# Patient Record
Sex: Female | Born: 1995 | Race: White | Hispanic: No | Marital: Married | Smoking: Never smoker
Health system: Southern US, Community
[De-identification: ages and names within clinical notes are randomized; demographics above are authoritative.]

## PROBLEM LIST (undated history)

## (undated) DIAGNOSIS — Q100 Congenital ptosis: Secondary | ICD-10-CM

## (undated) DIAGNOSIS — Q512 Other doubling of uterus, unspecified: Secondary | ICD-10-CM

## (undated) DIAGNOSIS — Q5128 Other doubling of uterus, other specified: Secondary | ICD-10-CM

## (undated) HISTORY — PX: BLEPHAROPLASTY: SUR158

## (undated) HISTORY — PX: OTHER SURGICAL HISTORY: SHX169

---

## 2017-03-19 ENCOUNTER — Encounter: Payer: Self-pay | Admitting: Medical

## 2017-03-19 ENCOUNTER — Ambulatory Visit (INDEPENDENT_AMBULATORY_CARE_PROVIDER_SITE_OTHER): Payer: 59 | Admitting: Medical

## 2017-03-19 VITALS — BP 128/80 | HR 81 | Ht 65.0 in | Wt 195.6 lb

## 2017-03-19 DIAGNOSIS — H1013 Acute atopic conjunctivitis, bilateral: Secondary | ICD-10-CM

## 2017-03-19 DIAGNOSIS — N926 Irregular menstruation, unspecified: Secondary | ICD-10-CM | POA: Diagnosis not present

## 2017-03-19 DIAGNOSIS — Z3201 Encounter for pregnancy test, result positive: Secondary | ICD-10-CM | POA: Diagnosis not present

## 2017-03-19 DIAGNOSIS — Z23 Encounter for immunization: Secondary | ICD-10-CM

## 2017-03-19 LAB — POCT URINE PREGNANCY: Preg Test, Ur: POSITIVE — AB

## 2017-03-19 NOTE — Patient Instructions (Signed)
Make sure you are taking a Prenatal vitamin  Begin Opcon A or other similar over the  Counter allergy eye drop  We updated your flu shot today  Try and get a copy of your vaccine records.   Eating Plan for Pregnant Women While you are pregnant, your body will require additional nutrition to help support your growing baby. It is recommended that you consume:  150 additional calories each day during your first trimester.  300 additional calories each day during your second trimester.  300 additional calories each day during your third trimester.  Eating a healthy, well-balanced diet is very important for your health and for your baby's health. You also have a higher need for some vitamins and minerals, such as folic acid, calcium, iron, and vitamin D. What do I need to know about eating during pregnancy?  Do not try to lose weight or go on a diet during pregnancy.  Choose healthy, nutritious foods. Choose  of a sandwich with a glass of milk instead of a candy bar or a high-calorie sugar-sweetened beverage.  Limit your overall intake of foods that have "empty calories." These are foods that have little nutritional value, such as sweets, desserts, candies, sugar-sweetened beverages, and fried foods.  Eat a variety of foods, especially fruits and vegetables.  Take a prenatal vitamin to help meet the additional needs during pregnancy, specifically for folic acid, iron, calcium, and vitamin D.  Remember to stay active. Ask your health care provider for exercise recommendations that are specific to you.  Practice good food safety and cleanliness, such as washing your hands before you eat and after you prepare raw meat. This helps to prevent foodborne illnesses, such as listeriosis, that can be very dangerous for your baby. Ask your health care provider for more information about listeriosis. What does 150 extra calories look like? Healthy options for an additional 150 calories each day  could be any of the following:  Plain low-fat yogurt (6-8 oz) with  cup of berries.  1 apple with 2 teaspoons of peanut butter.  Cut-up vegetables with  cup of hummus.  Low-fat chocolate milk (8 oz or 1 cup).  1 string cheese with 1 medium orange.   of a peanut butter and jelly sandwich on whole-wheat bread (1 tsp of peanut butter).  For 300 calories, you could eat two of those healthy options each day. What is a healthy amount of weight to gain? The recommended amount of weight for you to gain is based on your pre-pregnancy BMI. If your pre-pregnancy BMI was:  Less than 18 (underweight), you should gain 28-40 lb.  18-24.9 (normal), you should gain 25-35 lb.  25-29.9 (overweight), you should gain 15-25 lb.  Greater than 30 (obese), you should gain 11-20 lb.  What if I am having twins or multiples? Generally, pregnant women who will be having twins or multiples may need to increase their daily calories by 300-600 calories each day. The recommended range for total weight gain is 25-54 lb, depending on your pre-pregnancy BMI. Talk with your health care provider for specific guidance about additional nutritional needs, weight gain, and exercise during your pregnancy. What foods can I eat? Grains Any grains. Try to choose whole grains, such as whole-wheat bread, oatmeal, or brown rice. Vegetables Any vegetables. Try to eat a variety of colors and types of vegetables to get a full range of vitamins and minerals. Remember to wash your vegetables well before eating. Fruits Any fruits. Try to eat a  variety of colors and types of fruit to get a full range of vitamins and minerals. Remember to wash your fruits well before eating. Meats and Other Protein Sources Lean meats, including chicken, [place], fish, and lean cuts of beef, veal, or pork. Make sure that all meats are cooked to "well done." Tofu. Tempeh. Beans. Eggs. Peanut butter and other nut butters. Seafood, such as shrimp, crab,  and lobster. If you choose fish, select types that are higher in omega-3 fatty acids, including salmon, herring, mussels, trout, sardines, and pollock. Make sure that all meats are cooked to food-safe temperatures. Dairy Pasteurized milk and milk alternatives. Pasteurized yogurt and pasteurized cheese. Cottage cheese. Sour cream. Beverages Water. Juices that contain 100% fruit juice or vegetable juice. Caffeine-free teas and decaffeinated coffee. Drinks that contain caffeine are okay to drink, but it is better to avoid caffeine. Keep your total caffeine intake to less than 200 mg each day (12 oz of coffee, tea, or soda) or as directed by your health care provider. Condiments Any pasteurized condiments. Sweets and Desserts Any sweets and desserts. Fats and Oils Any fats and oils. The items listed above may not be a complete list of recommended foods or beverages. Contact your dietitian for more options. What foods are not recommended? Vegetables Unpasteurized (raw) vegetable juices. Fruits Unpasteurized (raw) fruit juices. Meats and Other Protein Sources Cured meats that have nitrates, such as bacon, salami, and hotdogs. Luncheon meats, bologna, or other deli meats (unless they are reheated until they are steaming hot). Refrigerated pate, meat spreads from a meat counter, smoked seafood that is found in the refrigerated section of a store. Raw fish, such as sushi or sashimi. High mercury content fish, such as tilefish, shark, swordfish, and king mackerel. Raw meats, such as tuna or beef tartare. Undercooked meats and poultry. Make sure that all meats are cooked to food-safe temperatures. Dairy Unpasteurized (raw) milk and any foods that have raw milk in them. Soft cheeses, such as feta, queso blanco, queso fresco, Brie, Camembert cheeses, blue-veined cheeses, and Panela cheese (unless it is made with pasteurized milk, which must be stated on the label). Beverages Alcohol. Sugar-sweetened  beverages, such as sodas, teas, or energy drinks. Condiments Homemade fermented foods and drinks, such as pickles, sauerkraut, or kombucha drinks. (Store-bought pasteurized versions of these are okay.) Other Salads that are made in the store, such as ham salad, chicken salad, egg salad, tuna salad, and seafood salad. The items listed above may not be a complete list of foods and beverages to avoid. Contact your dietitian for more information. This information is not intended to replace advice given to you by your health care provider. Make sure you discuss any questions you have with your health care provider. Document Released: 02/13/2014 Document Revised: 10/07/2015 Document Reviewed: 10/14/2013 Elsevier Interactive Patient Education  2018 [employer].    Common Medications Safe in Pregnancy  Acne:      Constipation:  Benzoyl Peroxide     Colace  Clindamycin      Dulcolax Suppository  Topica Erythromycin     Fibercon  Salicylic Acid      Metamucil         Miralax AVOID:        Senakot   Accutane    Cough:  Retin-A       Cough Drops  Tetracycline      Phenergan w/ Codeine if Rx  Minocycline      Robitussin (Plain & DM)  Antibiotics:  Crabs/Lice:  Ceclor       RID  Cephalosporins    AVOID:  E-Mycins      Kwell  Keflex  Macrobid/Macrodantin   Diarrhea:  Penicillin      Kao-Pectate  Zithromax      Imodium AD         PUSH FLUIDS AVOID:       Cipro     Fever:  Tetracycline      Tylenol (Regular or Extra  Minocycline       Strength)  Levaquin      Extra Strength-Do not          Exceed 8 tabs/24 hrs Caffeine:        '200mg'$ /day (equiv. To 1 cup of coffee or  approx. 3 12 oz sodas)         Gas: Cold/Hayfever:       Gas-X  Benadryl      Mylicon  Claritin       Phazyme  **Claritin-D        Chlor-Trimeton    Headaches:  Dimetapp      ASA-Free Excedrin  Drixoral-Non-Drowsy     Cold Compress  Mucinex (Guaifenasin)     Tylenol (Regular or Extra  Sudafed/Sudafed-12  Hour     Strength)  **Sudafed PE Pseudoephedrine   Tylenol Cold & Sinus     Vicks Vapor Rub  Zyrtec  **AVOID if Problems With Blood Pressure         Heartburn: Avoid lying down for at least 1 hour after meals  Aciphex      Maalox     Rash:  Milk of Magnesia     Benadryl    Mylanta       1% Hydrocortisone Cream  Pepcid  Pepcid Complete   Sleep Aids:  Prevacid      Ambien   Prilosec       Benadryl  Rolaids       Chamomile Tea  Tums (Limit 4/day)     Unisom  Zantac       Tylenol PM         Warm milk-add vanilla or  Hemorrhoids:       Sugar for taste  Anusol/Anusol H.C.  (RX: Analapram 2.5%)  Sugar Substitutes:  Hydrocortisone OTC     Ok in moderation  Preparation H      Tucks        Vaseline lotion applied to tissue with wiping    Herpes:     Throat:  Acyclovir      Oragel  Famvir  Valtrex     Vaccines:         Flu Shot Leg Cramps:       *Gardasil  Benadryl      Hepatitis A         Hepatitis B Nasal Spray:       Pneumovax  Saline Nasal Spray     Polio Booster         Tetanus Nausea:       Tuberculosis test or PPD  Vitamin B6 25 mg TID   AVOID:    Dramamine      *Gardasil  Emetrol       Live Poliovirus  Ginger Root 250 mg QID    MMR (measles, mumps &  High Complex Carbs @ Bedtime    rebella)  Sea Bands-Accupressure    Varicella (Chickenpox)  Unisom 1/2 tab TID     *No known complications  If received before Pain:         Known pregnancy;   Darvocet       Resume series after  Lortab        Delivery  Percocet    Yeast:   Tramadol      Femstat  Tylenol 3      Gyne-lotrimin  Ultram       Monistat  Vicodin           MISC:         All Sunscreens           Hair Coloring/highlights          Insect Repellant's          (Including DEET)         Mystic Tans

## 2017-03-19 NOTE — Progress Notes (Signed)
Subjective: Chief Complaint  Patient presents with  . New Patient (Initial Visit)    possible preg   Here as a new patient today.  [place]  [place]oved [place] end of [place].  Husband got job at [employer].   She works data entry.  She is here for recent home pregnancy test positive.   She notes having a positive pregnancy test 2 weeks ago.   LMP was 02/02/17.   Last birth control was 06/2016.  Had Nexplanon taken out in 06/2016.   Got married 06/2016, and although she has not actively been trying to get pregnant, not avoiding it either.   She recently started taking a prenatal vitamin.  No prior pregnancy.    She has been having some nausea, fatigue, some breast tenderness.   Nauseated in mornings in particular when she awakes.  Eats somewhat healthy, but also some eating out on the weekends.    She has been having irritated red and dry eyes.  No recent foreign body, no goupy discharge, no matted eyes.  No runny nose, no congestion.  No other concerns.      No past medical history on file.  No current outpatient medications on file prior to visit.   No current facility-administered medications on file prior to visit.    ROS as in subjective    Objective: BP 128/80   Pulse 81   Ht 5\' 5"  (1.651 m)   Wt 195 lb 9.6 oz (88.7 kg)   LMP 02/02/2017   SpO2 99%   BMI 32.55 kg/m   General appearance: alert, no distress, WD/WN,  HEENT: normocephalic, sclerae anicteric, +injected conjunctiva bilat, otherwise TMs pearly, nares patent, no discharge or erythema, pharynx normal Oral cavity: MMM, no lesions Neck: supple, no lymphadenopathy, no thyromegaly, no masses Heart: RRR, normal S1, S2, no murmurs Lungs: CTA bilaterally, no wheezes, rhonchi, or rales pulses: 2+ symmetric, upper and lower extremities, normal cap refill No edema    Assessment: Encounter Diagnoses  Name Primary?  . Missed periods Yes  . Need for influenza vaccination   . Pregnancy test positive   .  Allergic conjunctivitis of both eyes     Plan: Discussed + pregnancy test, advised she continue prenatal vitamins, discussed diet, exercise, avoidance of risky foods.  Serum beta hCG lab today and plan for referral to OB/Gyn.  Discussed need to begin prenatal care.  She will also try and get copy of her vaccine records.   Begin Opcon A OTC allergy drops for allergic conjunctivitis.  Counseled on the influenza virus vaccine.  Vaccine information sheet given.  Influenza vaccine given after consent obtained.  Emlyn was seen today for new patient (initial visit).  Diagnoses and all orders for this visit:  Missed periods -     POCT urine pregnancy -     hCG, quantitative, pregnancy  Need for influenza vaccination -     Flu Vaccine QUAD 6+ mos PF IM (Fluarix Quad PF)  Pregnancy test positive -     hCG, quantitative, pregnancy  Allergic conjunctivitis of both eyes

## 2017-03-20 ENCOUNTER — Telehealth: Payer: Self-pay | Admitting: Medical

## 2017-03-20 NOTE — Telephone Encounter (Signed)
Pt called for her labs, please call when we receive

## 2017-03-20 NOTE — Telephone Encounter (Signed)
Called and notified the pt and sent copy of labs.

## 2017-04-03 LAB — OB RESULTS CONSOLE ABO/RH: RH Type: NEGATIVE

## 2017-04-03 LAB — OB RESULTS CONSOLE HIV ANTIBODY (ROUTINE TESTING): HIV: NONREACTIVE

## 2017-04-03 LAB — OB RESULTS CONSOLE ANTIBODY SCREEN: Antibody Screen: NEGATIVE

## 2017-04-03 LAB — OB RESULTS CONSOLE RUBELLA ANTIBODY, IGM: Rubella: IMMUNE

## 2017-04-03 LAB — OB RESULTS CONSOLE HEPATITIS B SURFACE ANTIGEN: Hepatitis B Surface Ag: NEGATIVE

## 2017-04-03 LAB — OB RESULTS CONSOLE RPR: RPR: NONREACTIVE

## 2017-05-15 NOTE — L&D Delivery Note (Signed)
Delivery Note At 4:37 AM a viable female was delivered via Vaginal, Spontaneous (Presentation: ROA).  APGAR: 7, 9; weight pending.   Placenta status: S, I. 3V Cord with the following complications: none.  Cord pH: n/a  Anesthesia:  CLEA Episiotomy: None Lacerations: 2nd degree Suture Repair: 3.0 vicryl rapide Est. Blood Loss (mL):  250  Mom to postpartum.  Baby to Couplet care / Skin to Skin.  Anne Wood 10/29/2017, 5:01 AM

## 2017-05-30 DIAGNOSIS — Z3480 Encounter for supervision of other normal pregnancy, unspecified trimester: Secondary | ICD-10-CM | POA: Diagnosis not present

## 2017-05-30 DIAGNOSIS — Z363 Encounter for antenatal screening for malformations: Secondary | ICD-10-CM | POA: Diagnosis not present

## 2017-05-31 ENCOUNTER — Other Ambulatory Visit (HOSPITAL_COMMUNITY): Payer: Self-pay | Admitting: Obstetrics and Gynecology

## 2017-05-31 DIAGNOSIS — O283 Abnormal ultrasonic finding on antenatal screening of mother: Secondary | ICD-10-CM

## 2017-05-31 DIAGNOSIS — Z3689 Encounter for other specified antenatal screening: Secondary | ICD-10-CM

## 2017-05-31 DIAGNOSIS — Z3A19 19 weeks gestation of pregnancy: Secondary | ICD-10-CM

## 2017-06-07 ENCOUNTER — Encounter (HOSPITAL_COMMUNITY): Payer: Self-pay | Admitting: Obstetrics and Gynecology

## 2017-06-13 ENCOUNTER — Encounter (HOSPITAL_COMMUNITY): Payer: Self-pay | Admitting: *Deleted

## 2017-06-15 ENCOUNTER — Ambulatory Visit (HOSPITAL_COMMUNITY)
Admission: RE | Admit: 2017-06-15 | Discharge: 2017-06-15 | Disposition: A | Discharge status: Home / Self Care | Payer: BLUE CROSS/BLUE SHIELD | Source: Ambulatory Visit | Attending: Obstetrics and Gynecology | Admitting: Obstetrics and Gynecology

## 2017-06-15 ENCOUNTER — Encounter (HOSPITAL_COMMUNITY): Payer: Self-pay

## 2017-06-15 DIAGNOSIS — Z3689 Encounter for other specified antenatal screening: Secondary | ICD-10-CM

## 2017-06-15 DIAGNOSIS — Z368A Encounter for antenatal screening for other genetic defects: Secondary | ICD-10-CM | POA: Diagnosis not present

## 2017-06-15 DIAGNOSIS — O283 Abnormal ultrasonic finding on antenatal screening of mother: Secondary | ICD-10-CM

## 2017-06-15 DIAGNOSIS — Z3A19 19 weeks gestation of pregnancy: Secondary | ICD-10-CM | POA: Insufficient documentation

## 2017-06-15 HISTORY — DX: Congenital ptosis: Q10.0

## 2017-06-15 HISTORY — DX: Other doubling of uterus, unspecified: Q51.20

## 2017-06-15 HISTORY — DX: Other and unspecified doubling of uterus: Q51.28

## 2017-06-15 NOTE — Progress Notes (Signed)
Genetic Counseling  High-Risk Gestation Note  Appointment Date:  06/15/2017 Referred By: Zelphia CairoAdkins, Gretchen, MD Date of Birth:  [birth year] Partner:  Joselyn Glassmanyler   Pregnancy History: G2P0010 Estimated Date of Delivery: 11/09/17 Estimated Gestational Age: 2947w0d Attending: Particia NearingMartha Decker, MD   Mrs. Anzley Proano and her husband, Mr. Gerre Scullyler Noseworthy, were seen for genetic counseling because of the previous ultrasound finding of increased nuchal translucency.    In summary:  Reviewed increased nuchal translucency and associations  Discussed significance of prior screening for fetal aneuploidy- NIPS (Panorama) within normal limits  Offered additional screening  NIPS for single gene conditions (Vistara)- declined today  Ultrasound- performed today; see separate report  Fetal echocardiogram- patient reports this is scheduled  Discussed option of diagnostic testing  Amniocentesis for karyotype and microarray analysis- declined  Reviewed family history concerns  Ms. Lange previously had nuchal translucency (NT) assessment through her OB provider, which identified an increased NT measurement, reported to be 3.5-3.7 mm at CRL of 61 mm. Noninvasive prenatal screening (NIPS)/prenatal cell free DNA testing was performed. We reviewed that these results, [facility], were within normal limits for the chromosome conditions screened. Specifically, results indicated less than 1 in 10,000 risk for fetal Trisomy 21, Trisomy 18, Trisomy 13, and monosomy X. Triploidy and 22q11.2 deletion was also reported as low risk from the screening. Detailed ultrasound was performed today. Complete ultrasound results under separate cover.   We discussed that the fetal NT refers to a fluid filled space between the skin and soft tissues behind the cervical spine. This space is traditionally measurable between 11 and 13.[redacted] weeks gestation and is considered enlarged when the measurement is equal to or  greater than the 95th percentile for the gestational age. This couple was counseled regarding the various common etiologies for an enlarged NT including: aneuploidy, single gene conditions, cardiac or great vessel abnormalities, lymphatic system failure, decreased fetal movement, and fetal anemia.   We reviewed chromosomes, nondisjunction, and the common features and prognoses of Down syndrome and other aneuploidies. We reviewed the sensitivity and specificity of NIPS for aneuploidy. We reviewed the diagnostic testing option of amniocentesis for karyotype and chromosome microarray to assess for smaller chromosome aberrations. We reviewed risks, benefits, and limitations of amniocentesis, including the associated 1 in 300-500 risk for complications including spontaneous pregnancy loss.  We discussed the possible results that the tests might provide including: positive, negative, unanticipated, and no result.   In addition, we discussed that an NT of 3.5 mm is associated with an approximate 3% risk for a fetal cardiac anomaly. We discussed the options of a fetal echocardiogram and detailed anatomy ultrasound as methods to evaluate the fetal heart. Detailed ultrasound was performed today; see separate report. The patient reported that fetal echocardiogram was previously scheduled.   We also discussed single gene conditions. They were counseled that an increased NT is associated with an increased chance for specific single gene conditions including Noonan spectrum disorders, skeletal dysplasias, SLOS, CdLS, and many others. We discussed that these conditions are not routinely tested for prenatally unless ultrasound findings or family history significantly increase the suspicion of a specific single gene disorder; however, there is new noninvasive prenatal screening (NIPS) technology which assesses for specific alterations in 30 genes, including the genes associated with Noonan syndrome, some skeletal dysplasias,  and CdLS. We discussed that this testing, marketed as Javier DockerVistara, is available. We reviewed the benefits, limitations, and cost of this technology. They understand that many of the features of these conditions  can be detected by detailed ultrasound during the second trimester; however, ultrasound cannot definitively diagnose or rule out these conditions. Regarding autosomal recessive and some X-linked single gene conditions, we discussed the option of expanded carrier screening for the couple. We briefly discussed risks, benefits, and limitations of expanded carrier screening and variability of available screening panels.  We briefly reviewed common inheritance patterns (dominant, recessive, and X-linked) as well as the associated risks of recurrence. After careful consideration, Ms. Glowacki declined additional screening for single gene conditions at this time including Vistara and expanded carrier screening.   We then discussed that an increased NT value can be a normal variant, which can resolve during the pregnancy. They were counseled that the fetal prognosis depends on the underlying etiology of the enlarged NT and further anticipatory guidance can be provided if a diagnosis is discovered.   Both family histories were reviewed and found to be contributory for congenital heart disease for the father of the pregnancy and for his maternal half-brother. Mr. Komorowski reported that he was born with a hole in the heart that resolved over time and did not require surgical correction. He also reported that he has a kidney that is displaced but that the kidney function is within normal limits. He is currently 21 years old. Mr. Schexnider reported that his maternal half-brother has a hole in the heart which has not required surgical correction. He is currently 21 years old and otherwise healthy. We reviewed that congenital heart defects (CHDs) can be isolated or a feature of an underlying genetic condition. Congenital  heart defects are most often multifactorial in etiology, but can also result from chromosome aberrations, single gene conditions, or teratogenic exposures. We discussed that isolated, nonsyndromic CHDs occur in approximately 1% of the general population.We discussed that if Mr. Edgett's heart defect is isolated, then recurrence risk for his offspring is approximately 1.5-3%. However, it is possible that his kidney different and congenital heart disease are related to a single underlying etiology, in which case recurrence risk would depend upon the specific etiology. We reviewed that targeted ultrasound is available to assess fetal heart and kidney anatomy. The couple understands that ultrasound cannot diagnose or rule out all birth defects or genetic conditions prenatally.   Ms. Sather also reported two female maternal first cousins with behavior differences. They are her maternal uncle's sons and are half-siblings to each other, currently ages 51 and 21 years old. The patient reported that there were likely prenatal exposures for both individual to drugs and likely alcohol with one of the individuals. They do not have intellectual delay and were not described to have dysmorphic features. We discussed that the reported features can have environmental (including teratogenic), multifactorial, sporadic, or genetic causes. The reported family history is not particularly suggestive of a genetic syndrome. In the case of multifactorial or environmental causes, recurrence risk for the patient's offspring would likely be low. Without further information, an accurate risk assessment cannot be provided.  Without further information regarding the provided family history, an accurate genetic risk cannot be calculated. Further genetic counseling is warranted if more information is obtained.  Mrs. Camri Forbush denied exposure to environmental toxins or chemical agents. She denied the use of alcohol, tobacco or street  drugs. She denied significant viral illnesses during the course of her pregnancy. Her medical and surgical histories were noncontributory.   I counseled this couple regarding the above risks and available options.  The approximate face-to-face time with the genetic counselor was 40  minutes.   Quinn Plowman, MS Certified Genetic Counselor 06/15/2017

## 2017-06-18 ENCOUNTER — Other Ambulatory Visit (HOSPITAL_COMMUNITY): Payer: Self-pay

## 2017-06-18 ENCOUNTER — Encounter (HOSPITAL_COMMUNITY): Payer: Self-pay

## 2017-06-22 ENCOUNTER — Encounter: Payer: Self-pay | Admitting: Medical

## 2017-06-22 ENCOUNTER — Ambulatory Visit: Payer: BLUE CROSS/BLUE SHIELD | Admitting: Medical

## 2017-06-22 VITALS — BP 114/68 | HR 97 | Temp 98.3°F | Wt 199.4 lb

## 2017-06-22 DIAGNOSIS — R6889 Other general symptoms and signs: Secondary | ICD-10-CM

## 2017-06-22 DIAGNOSIS — J988 Other specified respiratory disorders: Secondary | ICD-10-CM | POA: Diagnosis not present

## 2017-06-22 DIAGNOSIS — Z3A20 20 weeks gestation of pregnancy: Secondary | ICD-10-CM | POA: Diagnosis not present

## 2017-06-22 DIAGNOSIS — J029 Acute pharyngitis, unspecified: Secondary | ICD-10-CM | POA: Diagnosis not present

## 2017-06-22 LAB — POCT RAPID STREP A (OFFICE): Rapid Strep A Screen: NEGATIVE

## 2017-06-22 LAB — POC INFLUENZA A&B (BINAX/QUICKVUE)
Influenza A, POC: NEGATIVE
Influenza B, POC: NEGATIVE

## 2017-06-22 NOTE — Addendum Note (Signed)
Addended by: Winn JockVALENTINE, Aubriel Khanna N on: 06/22/2017 01:10 PM   Modules accepted: Orders

## 2017-06-22 NOTE — Progress Notes (Signed)
Subjective: Chief Complaint  Patient presents with  . chills, congestion ,sore throat, bodyaches     stated last saturday , no fever    Here for illness.   Pregnant, 20 weeks at this point.   Sees Dr. Zelphia CairoGretchen Adkins.  Here for flu like symptoms, has had flu contacts.  She reports illness that started with sore throat 6 days ago.   Gradually has worsened this week.   She reports body aches, hot and cold flashes, sore throat, congested.  Had cough but this improved.  Using cough drops and throat numbing spray.  Using some Tylenol.   No other aggravating or relieving factors. No other complaint.  Past Medical History:  Diagnosis Date  . Congenital ptosis of both eyelids   . Uterus didelphys    Current Outpatient Medications on File Prior to Visit  Medication Sig Dispense Refill  . Prenatal Vit-Fe Fumarate-FA (PRENATAL VITAMIN PO) Take by mouth.     No current facility-administered medications on file prior to visit.    ROS as in subjective   Objective: BP 114/68   Pulse 97   Temp 98.3 F (36.8 C)   Wt 199 lb 6.4 oz (90.4 kg)   LMP 02/02/2017   SpO2 98%   BMI 33.18 kg/m   General appearance: alert, no distress, WD/WN, mildly ill appearing HEENT: normocephalic, sclerae anicteric, conjunctiva pink and moist, TMs pearly, nares patent, no discharge or erythema, pharynx with mild erythema, tonsils unremarkable Oral cavity: MMM, no lesions Neck: supple, no lymphadenopathy, no thyromegaly, no masses Heart: RRR, normal S1, S2, no murmurs Lungs: CTA bilaterally, no wheezes, rhonchi, or rales Pulses: 2+ symmetric      Assessment: Encounter Diagnoses  Name Primary?  . Sore throat Yes  . Flu-like symptoms   . Respiratory tract infection   . [redacted] weeks gestation of pregnancy      Plan Discussed symptoms, exam findings.  Negative for flu and strep swabs.   Patient Instructions  Recommendations:  Rest  Hydrate well  You can use OTC Tylenol for headache, pains  You  can use Benadryl OTC for cough/congestion  You can continue sore throat spray  Call if not much improved by Monday  Anne Wood was seen today for chills, congestion ,sore throat, bodyaches.  Diagnoses and all orders for this visit:  Sore throat  Flu-like symptoms -     POC Influenza A&B(BINAX/QUICKVUE)  Respiratory tract infection  [redacted] weeks gestation of pregnancy

## 2017-06-22 NOTE — Patient Instructions (Signed)
Recommendations:  Rest  Hydrate well  You can use OTC Tylenol for headache, pains  You can use Benadryl OTC for cough/congestion  You can continue sore throat spray  Call if not much improved by Monday

## 2017-06-25 ENCOUNTER — Telehealth: Payer: Self-pay | Admitting: Medical

## 2017-06-25 ENCOUNTER — Other Ambulatory Visit: Payer: Self-pay | Admitting: Medical

## 2017-06-25 MED ORDER — AMOXICILLIN 500 MG PO TABS: 500.0000 mg | ORAL_TABLET | Freq: Three times a day (TID) | ORAL | 0 refills | Status: DC

## 2017-06-25 NOTE — Telephone Encounter (Signed)
Gave to tammy to print out letter

## 2017-06-25 NOTE — Telephone Encounter (Signed)
Pt informed, she is out of work again today and needs another note, she would like to be out until Wednesday if possible.  And she will pick up antibiotic and start it today.  Can she have work note?

## 2017-06-25 NOTE — Telephone Encounter (Signed)
Yes that is fine

## 2017-06-25 NOTE — Telephone Encounter (Signed)
I sent antibiotic amoxicillin since symptoms continue.   Continue to hydrate well with water, use the recommendations we discussed the other day.  If cough is way worse or keeping her up at night, then have her call her OB/Gyn doctor to get their recommendations for cough.   There are some potential cough medications like Promethazine DM, but I would want OB/Gyn to weigh in on their preference.

## 2017-06-25 NOTE — Telephone Encounter (Signed)
Pt called back and states that she does not feel any  Better she is still coughing, lungs are itching feeling, still has some body aches, sore throat, which goes away by mid day, gets worse at night time and in the morning, has been taking tylneol, pt is wondering if you could send her something in, or what else she could do, pt uses  [facility] [address] - [place], [place] - [address] and pt can be reached at [phone]

## 2017-07-04 ENCOUNTER — Other Ambulatory Visit (HOSPITAL_COMMUNITY): Payer: Self-pay | Admitting: Obstetrics and Gynecology

## 2017-07-04 DIAGNOSIS — IMO0002 Reserved for concepts with insufficient information to code with codable children: Secondary | ICD-10-CM

## 2017-07-04 DIAGNOSIS — Z0489 Encounter for examination and observation for other specified reasons: Secondary | ICD-10-CM

## 2017-07-04 DIAGNOSIS — Z3A23 23 weeks gestation of pregnancy: Secondary | ICD-10-CM

## 2017-07-13 ENCOUNTER — Encounter (HOSPITAL_COMMUNITY): Payer: Self-pay

## 2017-07-13 ENCOUNTER — Ambulatory Visit (HOSPITAL_COMMUNITY)
Admission: RE | Admit: 2017-07-13 | Discharge: 2017-07-13 | Disposition: A | Discharge status: Home / Self Care | Payer: BLUE CROSS/BLUE SHIELD | Source: Ambulatory Visit | Attending: Obstetrics and Gynecology | Admitting: Obstetrics and Gynecology

## 2017-07-13 ENCOUNTER — Other Ambulatory Visit (HOSPITAL_COMMUNITY): Payer: Self-pay | Admitting: Obstetrics and Gynecology

## 2017-07-13 DIAGNOSIS — O34592 Maternal care for other abnormalities of gravid uterus, second trimester: Secondary | ICD-10-CM | POA: Insufficient documentation

## 2017-07-13 DIAGNOSIS — Z6791 Unspecified blood type, Rh negative: Secondary | ICD-10-CM | POA: Insufficient documentation

## 2017-07-13 DIAGNOSIS — Z0489 Encounter for examination and observation for other specified reasons: Secondary | ICD-10-CM

## 2017-07-13 DIAGNOSIS — O3402 Maternal care for unspecified congenital malformation of uterus, second trimester: Secondary | ICD-10-CM | POA: Diagnosis not present

## 2017-07-13 DIAGNOSIS — O283 Abnormal ultrasonic finding on antenatal screening of mother: Secondary | ICD-10-CM | POA: Diagnosis not present

## 2017-07-13 DIAGNOSIS — IMO0002 Reserved for concepts with insufficient information to code with codable children: Secondary | ICD-10-CM

## 2017-07-13 DIAGNOSIS — Z362 Encounter for other antenatal screening follow-up: Secondary | ICD-10-CM | POA: Diagnosis not present

## 2017-07-13 DIAGNOSIS — O26892 Other specified pregnancy related conditions, second trimester: Secondary | ICD-10-CM | POA: Insufficient documentation

## 2017-07-13 DIAGNOSIS — Z3A23 23 weeks gestation of pregnancy: Secondary | ICD-10-CM | POA: Diagnosis not present

## 2017-07-16 ENCOUNTER — Other Ambulatory Visit: Payer: Self-pay

## 2017-07-26 DIAGNOSIS — Q512 Other doubling of uterus, unspecified: Secondary | ICD-10-CM | POA: Diagnosis not present

## 2017-08-03 DIAGNOSIS — Z23 Encounter for immunization: Secondary | ICD-10-CM | POA: Diagnosis not present

## 2017-08-03 DIAGNOSIS — Z3A26 26 weeks gestation of pregnancy: Secondary | ICD-10-CM | POA: Diagnosis not present

## 2017-08-03 DIAGNOSIS — O360920 Maternal care for other rhesus isoimmunization, second trimester, not applicable or unspecified: Secondary | ICD-10-CM | POA: Diagnosis not present

## 2017-08-03 DIAGNOSIS — Z3480 Encounter for supervision of other normal pregnancy, unspecified trimester: Secondary | ICD-10-CM | POA: Diagnosis not present

## 2017-09-28 DIAGNOSIS — O368130 Decreased fetal movements, third trimester, not applicable or unspecified: Secondary | ICD-10-CM | POA: Diagnosis not present

## 2017-09-28 DIAGNOSIS — Z3A34 34 weeks gestation of pregnancy: Secondary | ICD-10-CM | POA: Diagnosis not present

## 2017-10-09 DIAGNOSIS — Z113 Encounter for screening for infections with a predominantly sexual mode of transmission: Secondary | ICD-10-CM | POA: Diagnosis not present

## 2017-10-09 DIAGNOSIS — Z3480 Encounter for supervision of other normal pregnancy, unspecified trimester: Secondary | ICD-10-CM | POA: Diagnosis not present

## 2017-10-23 DIAGNOSIS — Z3480 Encounter for supervision of other normal pregnancy, unspecified trimester: Secondary | ICD-10-CM | POA: Diagnosis not present

## 2017-10-28 ENCOUNTER — Other Ambulatory Visit: Payer: Self-pay

## 2017-10-28 ENCOUNTER — Inpatient Hospital Stay (HOSPITAL_COMMUNITY)
Admission: AD | Admit: 2017-10-28 | Discharge: 2017-10-31 | DRG: 807 | Disposition: A | Discharge status: Home / Self Care | Payer: BLUE CROSS/BLUE SHIELD | Attending: Obstetrics & Gynecology | Admitting: Obstetrics & Gynecology

## 2017-10-28 ENCOUNTER — Encounter (HOSPITAL_COMMUNITY): Payer: Self-pay | Admitting: *Deleted

## 2017-10-28 DIAGNOSIS — O134 Gestational [pregnancy-induced] hypertension without significant proteinuria, complicating childbirth: Principal | ICD-10-CM | POA: Diagnosis present

## 2017-10-28 DIAGNOSIS — O26893 Other specified pregnancy related conditions, third trimester: Secondary | ICD-10-CM | POA: Diagnosis present

## 2017-10-28 DIAGNOSIS — Z412 Encounter for routine and ritual male circumcision: Secondary | ICD-10-CM | POA: Diagnosis not present

## 2017-10-28 DIAGNOSIS — Z6791 Unspecified blood type, Rh negative: Secondary | ICD-10-CM | POA: Diagnosis not present

## 2017-10-28 DIAGNOSIS — O3403 Maternal care for unspecified congenital malformation of uterus, third trimester: Secondary | ICD-10-CM | POA: Diagnosis not present

## 2017-10-28 DIAGNOSIS — O283 Abnormal ultrasonic finding on antenatal screening of mother: Secondary | ICD-10-CM

## 2017-10-28 DIAGNOSIS — Q512 Other doubling of uterus, unspecified: Secondary | ICD-10-CM

## 2017-10-28 DIAGNOSIS — Z3A38 38 weeks gestation of pregnancy: Secondary | ICD-10-CM

## 2017-10-28 DIAGNOSIS — O139 Gestational [pregnancy-induced] hypertension without significant proteinuria, unspecified trimester: Secondary | ICD-10-CM | POA: Diagnosis present

## 2017-10-28 DIAGNOSIS — Z23 Encounter for immunization: Secondary | ICD-10-CM | POA: Diagnosis not present

## 2017-10-28 LAB — TYPE AND SCREEN
ABO/RH(D): O NEG
Antibody Screen: NEGATIVE

## 2017-10-28 LAB — COMPREHENSIVE METABOLIC PANEL
ALT: 25 U/L (ref 14–54)
AST: 30 U/L (ref 15–41)
Albumin: 3.3 g/dL — ABNORMAL LOW (ref 3.5–5.0)
Alkaline Phosphatase: 155 U/L — ABNORMAL HIGH (ref 38–126)
Anion gap: 12 (ref 5–15)
BUN: 11 mg/dL (ref 6–20)
CO2: 17 mmol/L — ABNORMAL LOW (ref 22–32)
Calcium: 8.8 mg/dL — ABNORMAL LOW (ref 8.9–10.3)
Chloride: 108 mmol/L (ref 101–111)
Creatinine, Ser: 0.65 mg/dL (ref 0.44–1.00)
GFR calc Af Amer: >60 mL/min (ref >60)
GFR calc non Af Amer: >60 mL/min (ref >60)
Glucose, Bld: 112 mg/dL — ABNORMAL HIGH (ref 65–99)
Potassium: 3.7 mmol/L (ref 3.5–5.1)
Sodium: 137 mmol/L (ref 135–145)
Total Bilirubin: 1 mg/dL (ref 0.3–1.2)
Total Protein: 6.8 g/dL (ref 6.5–8.1)

## 2017-10-28 LAB — CBC
HCT: 36.7 % (ref 36.0–46.0)
Hemoglobin: 12.1 g/dL (ref 12.0–15.0)
MCH: 26.9 pg (ref 26.0–34.0)
MCHC: 33 g/dL (ref 30.0–36.0)
MCV: 81.7 fL (ref 78.0–100.0)
Platelets: 176 10*3/uL (ref 150–400)
RBC: 4.49 MIL/uL (ref 3.87–5.11)
RDW: 15.3 % (ref 11.5–15.5)
WBC: 10.3 10*3/uL (ref 4.0–10.5)

## 2017-10-28 LAB — ABO/RH: ABO/RH(D): O NEG

## 2017-10-28 MED ORDER — ACETAMINOPHEN 325 MG PO TABS: 650.0000 mg | ORAL_TABLET | ORAL | Status: DC | PRN

## 2017-10-28 MED ORDER — FENTANYL CITRATE (PF) 100 MCG/2ML IJ SOLN
50.0000 ug | INTRAMUSCULAR | Status: DC | PRN
  Administered 2017-10-29: 100 ug via INTRAVENOUS
  Administered 2017-10-29: 50 ug via INTRAVENOUS
  Filled 2017-10-28 (×2): qty 2

## 2017-10-28 MED ORDER — OXYCODONE-ACETAMINOPHEN 5-325 MG PO TABS: 2.0000 | ORAL_TABLET | ORAL | Status: DC | PRN

## 2017-10-28 MED ORDER — OXYCODONE-ACETAMINOPHEN 5-325 MG PO TABS: 1.0000 | ORAL_TABLET | ORAL | Status: DC | PRN

## 2017-10-28 MED ORDER — SOD CITRATE-CITRIC ACID 500-334 MG/5ML PO SOLN: 30.0000 mL | ORAL | Status: DC | PRN

## 2017-10-28 MED ORDER — OXYTOCIN BOLUS FROM INFUSION
500.0000 mL | Freq: Once | INTRAVENOUS | Status: AC
  Administered 2017-10-29: 500 mL via INTRAVENOUS

## 2017-10-28 MED ORDER — MISOPROSTOL 25 MCG QUARTER TABLET
25.0000 ug | ORAL_TABLET | ORAL | Status: DC | PRN
  Administered 2017-10-28 (×2): 25 ug via VAGINAL
  Filled 2017-10-28 (×3): qty 1

## 2017-10-28 MED ORDER — LACTATED RINGERS IV SOLN: 500.0000 mL | INTRAVENOUS | Status: DC | PRN

## 2017-10-28 MED ORDER — FLEET ENEMA 7-19 GM/118ML RE ENEM: 1.0000 | ENEMA | RECTAL | Status: DC | PRN

## 2017-10-28 MED ORDER — TERBUTALINE SULFATE 1 MG/ML IJ SOLN
0.2500 mg | Freq: Once | INTRAMUSCULAR | Status: DC | PRN
  Filled 2017-10-28: qty 1

## 2017-10-28 MED ORDER — LACTATED RINGERS IV SOLN
INTRAVENOUS | Status: DC
  Administered 2017-10-28: 19:00:00 via INTRAVENOUS

## 2017-10-28 MED ORDER — OXYTOCIN 40 UNITS IN LACTATED RINGERS INFUSION - SIMPLE MED
2.5000 [IU]/h | INTRAVENOUS | Status: DC
  Administered 2017-10-29: 2.5 [IU]/h via INTRAVENOUS
  Filled 2017-10-28: qty 1000

## 2017-10-28 MED ORDER — ONDANSETRON HCL 4 MG/2ML IJ SOLN: 4.0000 mg | Freq: Four times a day (QID) | INTRAMUSCULAR | Status: DC | PRN

## 2017-10-28 MED ORDER — LIDOCAINE HCL (PF) 1 % IJ SOLN
30.0000 mL | INTRAMUSCULAR | Status: DC | PRN
  Filled 2017-10-28: qty 30

## 2017-10-28 MED ORDER — ZOLPIDEM TARTRATE 5 MG PO TABS
5.0000 mg | ORAL_TABLET | Freq: Every evening | ORAL | Status: DC | PRN
  Administered 2017-10-28: 5 mg via ORAL
  Filled 2017-10-28: qty 1

## 2017-10-28 NOTE — Anesthesia Pain Management Evaluation Note (Signed)
  CRNA Pain Management Visit Note  Patient: Anne Wood, 22 y.o., female  "Hello I am a member of the anesthesia team at [facility]. We have an anesthesia team available at all times to provide care throughout the hospital, including epidural management and anesthesia for C-section. I don't know your plan for the delivery whether it a natural birth, water birth, IV sedation, nitrous supplementation, doula or epidural, but we want to meet your pain goals."   1.Was your pain managed to your expectations on prior hospitalizations?   Yes   2.What is your expectation for pain management during this hospitalization?     Epidural  3.How can we help you reach that goal? Support prn  Record the patient's initial score and the patient's pain goal.   Pain: 0  Pain Goal: 4 [facility] wants you to be able to say your pain was always managed very well.  [facility] Hospital[facility]INKLE,[name] 10/28/2017

## 2017-10-28 NOTE — Progress Notes (Signed)
Dr Langston MaskerMorris states pt is GBS negative.

## 2017-10-28 NOTE — MAU Note (Signed)
Pt states was told to come in for induction of labor, because of BP.  Reports BP has been elevated the last 2 wks in the office.  Denies HA, visual changes, epigastric pain or increase in swelling. No bleeding , leaking or pain.

## 2017-10-28 NOTE — MAU Note (Signed)
Urine in lab 

## 2017-10-29 ENCOUNTER — Inpatient Hospital Stay (HOSPITAL_COMMUNITY): Payer: BLUE CROSS/BLUE SHIELD | Admitting: Anesthesiology

## 2017-10-29 ENCOUNTER — Encounter (HOSPITAL_COMMUNITY): Payer: Self-pay

## 2017-10-29 LAB — CBC
HCT: 38.1 % (ref 36.0–46.0)
Hemoglobin: 12.6 g/dL (ref 12.0–15.0)
MCH: 27.2 pg (ref 26.0–34.0)
MCHC: 33.1 g/dL (ref 30.0–36.0)
MCV: 82.3 fL (ref 78.0–100.0)
Platelets: 160 10*3/uL (ref 150–400)
RBC: 4.63 MIL/uL (ref 3.87–5.11)
RDW: 15.7 % — ABNORMAL HIGH (ref 11.5–15.5)
WBC: 10.9 10*3/uL — ABNORMAL HIGH (ref 4.0–10.5)

## 2017-10-29 LAB — RPR: RPR Ser Ql: NONREACTIVE

## 2017-10-29 MED ORDER — DIPHENHYDRAMINE HCL 50 MG/ML IJ SOLN: 12.5000 mg | INTRAMUSCULAR | Status: DC | PRN

## 2017-10-29 MED ORDER — PHENYLEPHRINE 40 MCG/ML (10ML) SYRINGE FOR IV PUSH (FOR BLOOD PRESSURE SUPPORT): 80.0000 ug | PREFILLED_SYRINGE | INTRAVENOUS | Status: DC | PRN

## 2017-10-29 MED ORDER — DIBUCAINE 1 % RE OINT: 1.0000 "application " | TOPICAL_OINTMENT | RECTAL | Status: DC | PRN

## 2017-10-29 MED ORDER — LIDOCAINE HCL (PF) 1 % IJ SOLN
INTRAMUSCULAR | Status: DC | PRN
  Administered 2017-10-29: 2 mL via EPIDURAL
  Administered 2017-10-29: 5 mL via EPIDURAL
  Administered 2017-10-29: 3 mL via EPIDURAL
  Administered 2017-10-29: 5 mL via EPIDURAL

## 2017-10-29 MED ORDER — OXYCODONE-ACETAMINOPHEN 5-325 MG PO TABS: 1.0000 | ORAL_TABLET | ORAL | Status: DC | PRN

## 2017-10-29 MED ORDER — DIPHENHYDRAMINE HCL 25 MG PO CAPS: 25.0000 mg | ORAL_CAPSULE | Freq: Four times a day (QID) | ORAL | Status: DC | PRN

## 2017-10-29 MED ORDER — LACTATED RINGERS IV SOLN: 500.0000 mL | Freq: Once | INTRAVENOUS | Status: DC

## 2017-10-29 MED ORDER — EPHEDRINE 5 MG/ML INJ: 10.0000 mg | INTRAVENOUS | Status: DC | PRN

## 2017-10-29 MED ORDER — SIMETHICONE 80 MG PO CHEW: 80.0000 mg | CHEWABLE_TABLET | ORAL | Status: DC | PRN

## 2017-10-29 MED ORDER — SENNOSIDES-DOCUSATE SODIUM 8.6-50 MG PO TABS
2.0000 | ORAL_TABLET | ORAL | Status: DC
  Administered 2017-10-30 (×2): 2 via ORAL
  Filled 2017-10-29 (×2): qty 2

## 2017-10-29 MED ORDER — EPHEDRINE 5 MG/ML INJ
10.0000 mg | INTRAVENOUS | Status: DC | PRN
  Filled 2017-10-29: qty 2

## 2017-10-29 MED ORDER — WITCH HAZEL-GLYCERIN EX PADS: 1.0000 "application " | MEDICATED_PAD | CUTANEOUS | Status: DC | PRN

## 2017-10-29 MED ORDER — BENZOCAINE-MENTHOL 20-0.5 % EX AERO
1.0000 "application " | INHALATION_SPRAY | CUTANEOUS | Status: DC | PRN
  Administered 2017-10-29: 1 via TOPICAL
  Filled 2017-10-29: qty 56

## 2017-10-29 MED ORDER — PHENYLEPHRINE 40 MCG/ML (10ML) SYRINGE FOR IV PUSH (FOR BLOOD PRESSURE SUPPORT)
PREFILLED_SYRINGE | INTRAVENOUS | Status: AC
  Filled 2017-10-29: qty 20

## 2017-10-29 MED ORDER — PRENATAL MULTIVITAMIN CH
1.0000 | ORAL_TABLET | Freq: Every day | ORAL | Status: DC
  Administered 2017-10-29 – 2017-10-31 (×3): 1 via ORAL
  Filled 2017-10-29 (×3): qty 1

## 2017-10-29 MED ORDER — COCONUT OIL OIL
1.0000 "application " | TOPICAL_OIL | Status: DC | PRN
  Administered 2017-10-30: 1 via TOPICAL
  Filled 2017-10-29: qty 120

## 2017-10-29 MED ORDER — FENTANYL 2.5 MCG/ML BUPIVACAINE 1/10 % EPIDURAL INFUSION (WH - ANES)
14.0000 mL/h | INTRAMUSCULAR | Status: DC | PRN
  Administered 2017-10-29: 14 mL/h via EPIDURAL

## 2017-10-29 MED ORDER — OXYCODONE-ACETAMINOPHEN 5-325 MG PO TABS: 2.0000 | ORAL_TABLET | ORAL | Status: DC | PRN

## 2017-10-29 MED ORDER — ZOLPIDEM TARTRATE 5 MG PO TABS: 5.0000 mg | ORAL_TABLET | Freq: Every evening | ORAL | Status: DC | PRN

## 2017-10-29 MED ORDER — PHENYLEPHRINE 40 MCG/ML (10ML) SYRINGE FOR IV PUSH (FOR BLOOD PRESSURE SUPPORT)
80.0000 ug | PREFILLED_SYRINGE | INTRAVENOUS | Status: DC | PRN
  Filled 2017-10-29: qty 5

## 2017-10-29 MED ORDER — TETANUS-DIPHTH-ACELL PERTUSSIS 5-2.5-18.5 LF-MCG/0.5 IM SUSP: 0.5000 mL | Freq: Once | INTRAMUSCULAR | Status: DC

## 2017-10-29 MED ORDER — ONDANSETRON HCL 4 MG/2ML IJ SOLN: 4.0000 mg | INTRAMUSCULAR | Status: DC | PRN

## 2017-10-29 MED ORDER — ACETAMINOPHEN 325 MG PO TABS: 650.0000 mg | ORAL_TABLET | ORAL | Status: DC | PRN

## 2017-10-29 MED ORDER — FENTANYL 2.5 MCG/ML BUPIVACAINE 1/10 % EPIDURAL INFUSION (WH - ANES)
INTRAMUSCULAR | Status: AC
  Filled 2017-10-29: qty 100

## 2017-10-29 MED ORDER — ONDANSETRON HCL 4 MG PO TABS: 4.0000 mg | ORAL_TABLET | ORAL | Status: DC | PRN

## 2017-10-29 MED ORDER — IBUPROFEN 600 MG PO TABS
600.0000 mg | ORAL_TABLET | Freq: Four times a day (QID) | ORAL | Status: DC
  Administered 2017-10-29 – 2017-10-31 (×9): 600 mg via ORAL
  Filled 2017-10-29 (×9): qty 1

## 2017-10-29 NOTE — Anesthesia Postprocedure Evaluation (Signed)
Anesthesia Post Note  Patient: Anne Wood  Procedure(s) Performed: AN AD HOC LABOR EPIDURAL     Patient location during evaluation: Mother Baby Anesthesia Type: Epidural Level of consciousness: awake Pain management: satisfactory to patient Vital Signs Assessment: post-procedure vital signs reviewed and stable Respiratory status: spontaneous breathing Cardiovascular status: stable Anesthetic complications: no    Last Vitals:  Vitals:   10/29/17 0652 10/29/17 0800  BP: 109/61 (!) 100/58  Pulse: (!) 54 (!) 57  Resp: 18 18  Temp: 37.1 C 37.1 C  SpO2:      Last Pain:  Vitals:   10/29/17 0705  TempSrc:   PainSc: 0-No pain   Pain Goal:                 [name],[name]

## 2017-10-29 NOTE — Lactation Note (Signed)
This note was copied from a baby's chart. Lactation Consultation Note  Patient Name: Anne Wood GNFAO'ZToday's Date: 10/29/2017 Reason for consult: Initial assessment;Primapara;1st time breastfeeding;Early term 37-38.6wks;Difficult latch  P1 mother whose infant is now 4416 hours old.  Mother requesting latch assistance.  Mother was holding baby STS as I arrived.  Mother stated that she wanted to pump because she and her baby "were not bonding."  In speaking with her I determined that she expected that baby should automatically go to the breast and feed.  I explained breastfeeding basics to her including tummy size, sleepy baby, feeding cues, STS, breast compressions and hand expression.  She stated that she had expressed approximately 7-8 mls and spoon fed him not even an hour ago.  I praised her efforts and explained to her that everything she was doing and observing were normal newborn behaviors for this time period.    Baby waking and I offered to assist with latch and she accepted.  Assisted in the football hold on the right breast.  Baby was able to latch but would not suck.  Showed mother different techniques for waking him and he remained sleepy at the breast.  I reassured her that he just fed recently and he will show cues when it is time to latch again.  Reviewed feeding cues.  Her mother is present and supportive and spoke with me in the hallway after the attempted breastfeeding.  She informed me that mother is a perfectionist and just wants to see her baby feed.  Grandmother is visiting until Friday from [place].  I reinforced that mother and baby have to learn together and mother can ask at any time for an RN or an LC to assist.    I also offered breast shells to mother and she willingly accepted.  She already has a manual pump at [facility] and will use this to help evert nipples.  Encouraged feeding 8-12 times/24 hours or earlier if he shows feeding cues, STS, breast massage and hand expression.   Provided a second colostrum container for any EBM she may obtain.  Mother knows to feed all EBM back to baby.    Mom made aware of O/P services, breastfeeding support groups, community resources, and our phone # for post-discharge questions. Encouraged mother to call or come back for an OP visit but to be sure to call her insurance prior to be sure visit is covered.  Father is returning tonight to stay with mother.  Mother will call as needed for assistance.   Maternal Data Formula Feeding for Exclusion: No Has patient been taught Hand Expression?: Yes Does the patient have breastfeeding experience prior to this delivery?: No  Feeding Feeding Type: Breast Fed Length of feed: 0 min  LATCH Score Latch: Too sleepy or reluctant, no latch achieved, no sucking elicited.  Audible Swallowing: None  Type of Nipple: Everted at rest and after stimulation(short shafted bilaterally)  Comfort (Breast/Nipple): Soft / non-tender  Hold (Positioning): Assistance needed to correctly position infant at breast and maintain latch.  LATCH Score: 5  Interventions    Lactation Tools Discussed/Used     Consult Status Consult Status: Follow-up Date: 10/30/17 Follow-up type: In-patient    Anne Wood R Anne Wood 10/29/2017, 9:30 PM

## 2017-10-29 NOTE — Anesthesia Preprocedure Evaluation (Addendum)
Anesthesia Evaluation  Patient identified by MRN, date of birth, ID band Patient awake    Reviewed: Allergy & Precautions, NPO status , Patient's Chart, lab work & pertinent test results  History of Anesthesia Complications Negative for: history of anesthetic complications  Airway Mallampati: II  TM Distance: >3 FB Neck ROM: Full    Dental  (+) Dental Advisory Given   Pulmonary neg pulmonary ROS,    breath sounds clear to auscultation       Cardiovascular hypertension (pregnancy induced),  Rhythm:Regular Rate:Normal     Neuro/Psych negative neurological ROS     GI/Hepatic Neg liver ROS, GERD  ,  Endo/Other  negative endocrine ROS  Renal/GU negative Renal ROS     Musculoskeletal   Abdominal (+) + obese,   Peds  Hematology plt 160k   Anesthesia Other Findings   Reproductive/Obstetrics (+) Pregnancy                            Anesthesia Physical Anesthesia Plan  ASA: III  Anesthesia Plan: Epidural   Post-op Pain Management:    Induction:   PONV Risk Score and Plan: 2 and Treatment may vary due to age or medical condition  Airway Management Planned: Natural Airway  Additional Equipment:   Intra-op Plan:   Post-operative Plan:   Informed Consent: I have reviewed the patients History and Physical, chart, labs and discussed the procedure including the risks, benefits and alternatives for the proposed anesthesia with the patient or authorized representative who has indicated his/her understanding and acceptance.   Dental advisory given  Plan Discussed with:   Anesthesia Plan Comments: (Patient identified. Risks/Benefits/Options discussed with patient including but not limited to bleeding, infection, nerve damage, paralysis, failed block, incomplete pain control, headache, blood pressure changes, nausea, vomiting, reactions to medication both or allergic, itching and postpartum back  pain. Confirmed with bedside nurse the patient's most recent platelet count. Confirmed with patient that they are not currently taking any anticoagulation, have any bleeding history or any family history of bleeding disorders. Patient expressed understanding and wished to proceed. All questions were answered. )       Anesthesia Quick Evaluation

## 2017-10-29 NOTE — Anesthesia Procedure Notes (Signed)
Epidural Patient location during procedure: OB Start time: 10/29/2017 2:10 AM End time: 10/29/2017 2:49 AM  Staffing Anesthesiologist: Jairo BenJackson, Katlynn Naser, MD Performed: anesthesiologist   Preanesthetic Checklist Completed: patient identified, surgical consent, pre-op evaluation, timeout performed, IV checked, risks and benefits discussed and monitors and equipment checked  Epidural Patient position: sitting Prep: site prepped and draped and DuraPrep Patient monitoring: blood pressure, continuous pulse ox and heart rate Approach: midline Location: L2-L3 Injection technique: LOR air  Needle:  Needle type: Tuohy  Needle gauge: 17 G Needle length: 9 cm Needle insertion depth: 5 cm Catheter type: closed end flexible Catheter size: 19 Gauge Catheter at skin depth: 10 cm Test dose: negative (1% lidocaine local)  Assessment Events: blood aspirated, injection not painful, no injection resistance, negative IV test and no paresthesia  Additional Notes Pt identified in Labor room.  Monitors applied. Working IV access confirmed. Sterile prep, drape lumbar spine.  1% lido local L 3,4.  #17ga Touhy LOR air at 5 cm L 3,4, dosed through needle, cath into blood vessel, discussed with patient and husband, despite being 9-10 cm dilated, they wish for epidural analgesia for delivery, repeat local L 2,3, #17ga Touhy LOR air 5 cm, cath in easily to 10 cm skin. Test dose OK, cath dosed and infusion begun.  Patient asymptomatic, VSS, no heme aspirated, tolerated well.  Sandford Craze Maleta Pacha, MDReason for block:procedure for pain

## 2017-10-29 NOTE — H&P (Signed)
Anne Wood is a 22 y.o. female presenting for induction of labor secondary to gestational hypertension.  Over the last two weeks in the office, BPs have been elevated mildly; normal pre-eclampsia labs.  The patient denies HA, CP/SOB, RUQ pain or visual disturbance.  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  Thickened NT was noted and fetal ECHO wnl; declined amnio.  GBS negative.  Comfortable with epidural.    OB History    Gravida  2   Para      Term      Preterm      AB  1   Living  0     SAB  1   TAB      Ectopic      Multiple      Live Births             Past Medical History:  Diagnosis Date  . Congenital ptosis of both eyelids   . Uterus didelphys    Past Surgical History:  Procedure Laterality Date  . BLEPHAROPLASTY    . EYE SURGERY    . NECK SURGERY    . Thyroidglosal duct cyst     Age 81   Family History: family history includes Cancer in her paternal grandmother; Diabetes in her father and maternal grandmother; Heart disease in her paternal grandfather; Hypertension in her father, maternal grandfather, maternal grandmother, and mother. Social History:  reports that she has never smoked. She has never used smokeless tobacco. She reports that she does not drink alcohol or use drugs.     Maternal Diabetes: No Genetic Screening: Declined Maternal Ultrasounds/Referrals: Normal Fetal Ultrasounds or other Referrals:  Other: thickened NT with normal fetal ECHO 3/14 Maternal Substance Abuse:  No Significant Maternal Medications:  None Significant Maternal Lab Results:  Lab values include: Group B Strep negative, Rh negative Other Comments:  None  ROS Maternal Medical History:  Fetal activity: Perceived fetal activity is normal.   Last perceived fetal movement was within the past hour.    Prenatal complications: PIH.   Prenatal Complications - Diabetes: none.    Dilation: 10 Effacement (%): 70, 80 Station:  -2 Exam by:: Dr Langston Masker Blood pressure 116/65, pulse (!) 51, temperature 97.7 F (36.5 C), temperature source Axillary, resp. rate 20, height 5\' 5"  (1.651 m), weight 209 lb 8 oz (95 kg), last menstrual period 02/02/2017, SpO2 100 %. Maternal Exam:  Uterine Assessment: Contraction strength is moderate.  Contraction frequency is regular.   Abdomen: Patient reports no abdominal tenderness. Fundal height is c/w dates.   Estimated fetal weight is 7#.   Fetal presentation: vertex  Introitus: Normal vulva. Amniotic fluid character: clear.  Pelvis: adequate for delivery.   Cervix: Cervix evaluated by digital exam.     Physical Exam  Constitutional: She is oriented to person, place, and time. She appears well-developed and well-nourished.  GI: Soft. There is no rebound and no guarding.  Neurological: She is alert and oriented to person, place, and time.  Skin: Skin is warm and dry.  Psychiatric: She has a normal mood and affect. Her behavior is normal.    Prenatal labs: ABO, Rh: --/--/O NEG, O NEG Performed at [facility], [address], [place], [place] [address]  [phone]) Antibody: NEG (06/16 1855) Rubella: Immune (11/20 0000) RPR: Nonreactive (11/20 0000)  HBsAg: Negative (11/20 0000)  HIV: Non-reactive (11/20 0000)  GBS:     Assessment/Plan: 21yo G1 at [redacted]w[redacted]d with GHTN for IOL -Anticipate  NSVD   Anne Wood 10/29/2017, 2:58 AM

## 2017-10-29 NOTE — Progress Notes (Signed)
Patient doing well. No complaints.  BP 109/61 (BP Location: Left Arm)   Pulse (!) 54   Temp 98.8 F (37.1 C) (Oral)   Resp 18   Ht 5\' 5"  (1.651 m)   Wt 95 kg (209 lb 8 oz)   LMP 02/02/2017   SpO2 100%   Breastfeeding? Unknown   BMI 34.86 kg/m  Results for orders placed or performed during the hospital encounter of 10/28/17 (from the past 24 hour(s))  CBC     Status: None   Collection Time: 10/28/17  6:55 PM  Result Value Ref Range   WBC 10.3 4.0 - 10.5 K/uL   RBC 4.49 3.87 - 5.11 MIL/uL   Hemoglobin 12.1 12.0 - 15.0 g/dL   HCT 40.936.7 81.136.0 - 91.446.0 %   MCV 81.7 78.0 - 100.0 fL   MCH 26.9 26.0 - 34.0 pg   MCHC 33.0 30.0 - 36.0 g/dL   RDW 78.215.3 95.611.5 - 21.315.5 %   Platelets 176 150 - 400 K/uL  Type and screen [facility]     Status: None   Collection Time: 10/28/17  6:55 PM  Result Value Ref Range   ABO/RH(D) O NEG    Antibody Screen NEG    Sample Expiration      10/31/2017 Performed at [facility], [address] [place], [place] [address]   RPR     Status: None   Collection Time: 10/28/17  6:55 PM  Result Value Ref Range   RPR Ser Ql Non Reactive Non Reactive  Comprehensive metabolic panel     Status: Abnormal   Collection Time: 10/28/17  6:55 PM  Result Value Ref Range   Sodium 137 135 - 145 mmol/L   Potassium 3.7 3.5 - 5.1 mmol/L   Chloride 108 101 - 111 mmol/L   CO2 17 (L) 22 - 32 mmol/L   Glucose, Bld 112 (H) 65 - 99 mg/dL   BUN 11 6 - 20 mg/dL   Creatinine, Ser 8.460.65 0.44 - 1.00 mg/dL   Calcium 8.8 (L) 8.9 - 10.3 mg/dL   Total Protein 6.8 6.5 - 8.1 g/dL   Albumin 3.3 (L) 3.5 - 5.0 g/dL   AST 30 15 - 41 U/L   ALT 25 14 - 54 U/L   Alkaline Phosphatase 155 (H) 38 - 126 U/L   Total Bilirubin 1.0 0.3 - 1.2 mg/dL   GFR calc non Af Amer >60 >60 mL/min   GFR calc Af Amer >60 >60 mL/min   Anion gap 12 5 - 15  ABO/Rh     Status: None   Collection Time: 10/28/17  6:55 PM  Result Value Ref Range   ABO/RH(D)      O NEG Performed at [facility], [address] [place], [place] [address]   CBC     Status: Abnormal   Collection Time: 10/29/17  1:54 AM  Result Value Ref Range   WBC 10.9 (H) 4.0 - 10.5 K/uL   RBC 4.63 3.87 - 5.11 MIL/uL   Hemoglobin 12.6 12.0 - 15.0 g/dL   HCT 28.438.1 13.236.0 - 44.046.0 %   MCV 82.3 78.0 - 100.0 fL   MCH 27.2 26.0 - 34.0 pg   MCHC 33.1 30.0 - 36.0 g/dL   RDW 10.215.7 (H) 72.511.5 - 36.615.5 %   Platelets 160 150 - 400 K/uL   Abdomen is soft and non tender  Lochia WNL  PPD # 0  Routine care Gestational hypertension

## 2017-10-30 LAB — CBC
HCT: 32.6 % — ABNORMAL LOW (ref 36.0–46.0)
Hemoglobin: 10.6 g/dL — ABNORMAL LOW (ref 12.0–15.0)
MCH: 27.1 pg (ref 26.0–34.0)
MCHC: 32.5 g/dL (ref 30.0–36.0)
MCV: 83.4 fL (ref 78.0–100.0)
Platelets: 156 10*3/uL (ref 150–400)
RBC: 3.91 MIL/uL (ref 3.87–5.11)
RDW: 15.9 % — ABNORMAL HIGH (ref 11.5–15.5)
WBC: 8.7 10*3/uL (ref 4.0–10.5)

## 2017-10-30 LAB — BIRTH TISSUE RECOVERY COLLECTION (PLACENTA DONATION)

## 2017-10-30 NOTE — Progress Notes (Signed)
Post Partum Day 1 Subjective: no complaints  Objective: Blood pressure (!) 96/51, pulse (!) 58, temperature 98.2 F (36.8 C), temperature source Oral, resp. rate 18, height 5\' 5"  (1.651 m), weight 209 lb 8 oz (95 kg), last menstrual period 02/02/2017, SpO2 99 %, unknown if currently breastfeeding.  Physical Exam:  General: alert Lochia: appropriate Uterine Fundus: firm Incision: healing well DVT Evaluation: No evidence of DVT seen on physical exam.  Recent Labs    10/29/17 0154 10/30/17 0604  HGB 12.6 10.6*  HCT 38.1 32.6*    Assessment/Plan: Plan for discharge tomorrow   LOS: 2 days   Anne PicaRichard M Mckenzi Wood 10/30/2017, 8:15 AM

## 2017-10-31 NOTE — Discharge Summary (Signed)
Obstetric Discharge Summary Reason for Admission: induction of labor Prenatal Procedures: none Intrapartum Procedures: spontaneous vaginal delivery Postpartum Procedures: none Complications-Operative and Postpartum: 2 degree perineal laceration Hemoglobin  Date Value Ref Range Status  10/30/2017 10.6 (L) 12.0 - 15.0 g/dL Final   HCT  Date Value Ref Range Status  10/30/2017 32.6 (L) 36.0 - 46.0 % Final    Physical Exam:  General: alert, cooperative, appears stated age and no distress Lochia: appropriate Uterine Fundus: firm Incision: healing well DVT Evaluation: No evidence of DVT seen on physical exam.  Discharge Diagnoses: Term Pregnancy-delivered  Discharge Information: Date: 10/31/2017 Activity: pelvic rest Diet: routine Medications: None Condition: stable Instructions: refer to practice specific booklet Discharge to: home   Newborn Data: Live born female  Birth Weight: 7 lb (3175 g) APGAR: 7, 9  Newborn Delivery   Birth date/time:  10/29/2017 04:37:00 Delivery type:  Vaginal, Spontaneous     Home with mother.  Brennyn Ortlieb C 10/31/2017, 9:22 AM

## 2017-10-31 NOTE — Lactation Note (Signed)
This note was copied from a baby's chart. Lactation Consultation Note  Patient Name: Anne Madolyn FriezeMadisyn Wood AVWUJ'WToday's Date: 10/31/2017 Reason for consult: Follow-up assessment Mom is pumping every 3 hours and obtaining 20-30 mls per pumping.  Instructed on standard pump setting.  She has a Even Flo pump at home but plans on obtaining a pump through [employer].  Breasts are slightly tender.  Encouraged to call with concerns prn.  Maternal Data    Feeding    LATCH Score                   Interventions    Lactation Tools Discussed/Used     Consult Status Consult Status: Complete Follow-up type: Call as needed    Huston FoleyMOULDEN, Diahann Guajardo S 10/31/2017, 9:48 AM

## 2017-11-28 ENCOUNTER — Telehealth: Payer: Self-pay | Admitting: Medical

## 2017-11-28 NOTE — Telephone Encounter (Signed)
Pt said thanks and she's doing ok. I informed patient to give us a call if she needs anything.

## 2017-11-28 NOTE — Telephone Encounter (Signed)
Call and see how she is doing?   Give her congratulations on her new baby?   I had received notice she had a baby.

## 2017-12-11 DIAGNOSIS — D509 Iron deficiency anemia, unspecified: Secondary | ICD-10-CM | POA: Diagnosis not present

## 2017-12-12 DIAGNOSIS — Z30017 Encounter for initial prescription of implantable subdermal contraceptive: Secondary | ICD-10-CM | POA: Diagnosis not present

## 2017-12-12 DIAGNOSIS — Z3202 Encounter for pregnancy test, result negative: Secondary | ICD-10-CM | POA: Diagnosis not present

## 2018-02-21 ENCOUNTER — Encounter: Payer: BLUE CROSS/BLUE SHIELD | Admitting: Medical

## 2018-03-07 ENCOUNTER — Ambulatory Visit: Payer: BLUE CROSS/BLUE SHIELD | Admitting: Medical

## 2018-03-07 ENCOUNTER — Encounter: Payer: Self-pay | Admitting: Medical

## 2018-03-07 ENCOUNTER — Telehealth: Payer: Self-pay | Admitting: Medical

## 2018-03-07 VITALS — BP 118/78 | HR 91 | Temp 98.2°F | Ht 64.0 in | Wt 186.8 lb

## 2018-03-07 DIAGNOSIS — G5621 Lesion of ulnar nerve, right upper limb: Secondary | ICD-10-CM | POA: Diagnosis not present

## 2018-03-07 DIAGNOSIS — Z0000 Encounter for general adult medical examination without abnormal findings: Secondary | ICD-10-CM | POA: Diagnosis not present

## 2018-03-07 DIAGNOSIS — Z23 Encounter for immunization: Secondary | ICD-10-CM | POA: Diagnosis not present

## 2018-03-07 DIAGNOSIS — Z131 Encounter for screening for diabetes mellitus: Secondary | ICD-10-CM | POA: Diagnosis not present

## 2018-03-07 LAB — POCT URINALYSIS DIP (PROADVANTAGE DEVICE)
Bilirubin, UA: NEGATIVE
Blood, UA: NEGATIVE
Glucose, UA: NEGATIVE mg/dL
Ketones, POC UA: NEGATIVE mg/dL
Leukocytes, UA: NEGATIVE
Nitrite, UA: NEGATIVE
Protein Ur, POC: NEGATIVE mg/dL
Specific Gravity, Urine: 1.025
Urobilinogen, Ur: 3.5
pH, UA: 6 (ref 5.0–8.0)

## 2018-03-07 NOTE — Progress Notes (Signed)
Subjective:   HPI  Anne Wood is a 22 y.o. female who presents for Chief Complaint  Patient presents with  . othe    CPE fasting/ flu shot    Medical care team includes: Brit Wernette, Kermit Balo, PA-C here for primary care Dentist Eye doctor Gynecology  Concerns: Had her first child 4 months ago.  Having some issues with right wrist.   Grip strength reduced, thinks she has ulnar nerve issues , fourth and fifth finger on the right tingles.  She is right-handed.  She does have a tetanus booster last year having a baby  Reviewed their medical, surgical, family, social, medication, and allergy history and updated chart as appropriate.  Past Medical History:  Diagnosis Date  . Congenital ptosis of both eyelids   . Uterus didelphys     Past Surgical History:  Procedure Laterality Date  . BLEPHAROPLASTY    . Thyroidglosal duct cyst     Age 68    Social History   Socioeconomic History  . Marital status: Married    Spouse name: Not on file  . Number of children: Not on file  . Years of education: Not on file  . Highest education level: Not on file  Occupational History  . Not on file  Social Needs  . Financial resource strain: Not on file  . Food insecurity:    Worry: Not on file    Inability: Not on file  . Transportation needs:    Medical: Not on file    Non-medical: Not on file  Tobacco Use  . Smoking status: Never Smoker  . Smokeless tobacco: Never Used  Substance and Sexual Activity  . Alcohol use: No    Frequency: Never  . Drug use: No  . Sexual activity: Not on file  Lifestyle  . Physical activity:    Days per week: Not on file    Minutes per session: Not on file  . Stress: Not on file  Relationships  . Social connections:    Talks on phone: Not on file    Gets together: Not on file    Attends religious service: Not on file    Active member of club or organization: Not on file    Attends meetings of clubs or organizations: Not on file     Relationship status: Not on file  . Intimate partner violence:    Fear of current or ex partner: Not on file    Emotionally abused: Not on file    Physically abused: Not on file    Forced sexual activity: Not on file  Other Topics Concern  . Not on file  Social History Narrative   Married, 36mo old baby, exercise some, stay at home mom.   02/2018.    Family History  Problem Relation Age of Onset  . Hypertension Mother   . Hypertension Father   . Diabetes Father   . Diabetes Maternal Grandmother   . Hypertension Maternal Grandmother   . Hypertension Maternal Grandfather   . Cancer Paternal Grandmother        pancreas  . Heart disease Paternal Grandfather      Current Outpatient Medications:  .  acetaminophen (TYLENOL) 500 MG tablet, Take 500 mg by mouth every 6 (six) hours as needed for mild pain or headache., Disp: , Rfl:  .  calcium carbonate (TUMS - DOSED IN MG ELEMENTAL CALCIUM) 500 MG chewable tablet, Chew 1 tablet by mouth 2 (two) times daily as needed for indigestion or  heartburn. , Disp: , Rfl:  .  Prenatal Vit-Fe Fumarate-FA (PRENATAL VITAMIN PO), Take 1 tablet by mouth daily. , Disp: , Rfl:  .  ranitidine (ZANTAC) 150 MG tablet, Take 150 mg by mouth 2 (two) times daily as needed for heartburn. , Disp: , Rfl:   Allergies  Allergen Reactions  . Azithromycin     hives  . Psudatabs [Pseudoephedrine Hcl] Hives and Other (See Comments)    fever     Review of Systems Constitutional: -fever, -chills, -sweats, -unexpected weight change, -decreased appetite, -fatigue Allergy: -sneezing, -itching, -congestion Dermatology: -changing moles, --rash, -lumps ENT: -runny nose, -ear pain, -sore throat, -hoarseness, -sinus pain, -teeth pain, - ringing in ears, -hearing loss, -nosebleeds Cardiology: -chest pain, -palpitations, -swelling, -difficulty breathing when lying flat, -waking up short of breath Respiratory: -cough, -shortness of breath, -difficulty breathing with exercise  or exertion, -wheezing, -coughing up blood Gastroenterology: -abdominal pain, -nausea, -vomiting, -diarrhea, -constipation, -blood in stool, -changes in bowel movement, -difficulty swallowing or eating Hematology: -bleeding, -bruising  Musculoskeletal: -joint aches, -muscle aches, -joint swelling, -back pain, -neck pain, -cramping, -changes in gait Ophthalmology: denies vision changes, eye redness, itching, discharge Urology: -burning with urination, -difficulty urinating, -blood in urine, -urinary frequency, -urgency, -incontinence Neurology: -headache, -weakness, -tingling, -numbness, -memory loss, -falls, -dizziness Psychology: -depressed mood, -agitation, -sleep problems Breast/gyn: -breast tenderness, -discharge, -lumps, -vaginal discharge,- irregular periods, -heavy periods     Objective:  BP 118/78 (BP Location: Right Arm, Patient Position: Sitting)   Pulse 91   Temp 98.2 F (36.8 C)   Ht 5\' 4"  (1.626 m)   Wt 186 lb 12.8 oz (84.7 kg)   SpO2 96%   Breastfeeding? No   BMI 32.06 kg/m   General appearance: alert, no distress, WD/WN, Caucasian female Skin: unremarkable HEENT: normocephalic, conjunctiva/corneas normal, sclerae anicteric, PERRLA, EOMi, nares patent, no discharge or erythema, pharynx normal Oral cavity: MMM, tongue normal, teeth normal Neck: supple, no lymphadenopathy, no thyromegaly, no masses, normal ROM, no bruits Chest: non tender, normal shape and expansion Heart: RRR, normal S1, S2, no murmurs Lungs: CTA bilaterally, no wheezes, rhonchi, or rales Abdomen: +bs, soft, non tender, non distended, no masses, no hepatomegaly, no splenomegaly, no bruits Back: non tender, normal ROM, no scoliosis Musculoskeletal: right wrist with mild central tenderness, otherwise upper extremities non tender, no obvious deformity, normal ROM throughout, lower extremities non tender, no obvious deformity, normal ROM throughout Extremities: no edema, no cyanosis, no clubbing Pulses: 2+  symmetric, upper and lower extremities, normal cap refill Neurological:+tinels on right hand, otherwise  alert, oriented x 3, CN2-12 intact, strength normal upper extremities and lower extremities, sensation normal throughout, DTRs 2+ throughout, no cerebellar signs, gait normal Psychiatric: normal affect, behavior normal, pleasant  Breast/gyn/rectal - deferred to gynecology    Assessment and Plan :   Encounter Diagnoses  Name Primary?  . Routine general medical examination at a health care facility Yes  . Need for influenza vaccination   . Impingement of right ulnar nerve   . Screening for diabetes mellitus     Physical exam - discussed and counseled on healthy lifestyle, diet, exercise, preventative care, vaccinations, sick and well care, proper use of emergency dept and after hours care, and addressed their concerns.    Health screening: See your eye doctor yearly for routine vision care. See your dentist yearly for routine dental care including hygiene visits twice yearly.  Vaccinations: Advised yearly influenza vaccine Counseled on the influenza virus vaccine.  Vaccine information sheet given.  Influenza vaccine  given after consent obtained.  Refer to orthopedics for ulnar nerve syndrome.  She has failed conservative therapy with NSAIDs, rest.   Markala was seen today for othe.  Diagnoses and all orders for this visit:  Routine general medical examination at a health care facility -     POCT Urinalysis DIP (Proadvantage Device) -     Comprehensive metabolic panel -     CBC -     Lipid panel -     TSH -     Hemoglobin A1c  Need for influenza vaccination -     Flu Vaccine QUAD 36+ mos IM  Impingement of right ulnar nerve  Screening for diabetes mellitus -     Hemoglobin A1c   Follow-up pending labs, yearly for physical

## 2018-03-07 NOTE — Telephone Encounter (Signed)
Refer to orthopedics for ulnar nerve issue

## 2018-03-08 ENCOUNTER — Telehealth: Payer: Self-pay | Admitting: Medical

## 2018-03-08 LAB — COMPREHENSIVE METABOLIC PANEL
ALT: 26 IU/L (ref 0–32)
AST: 18 IU/L (ref 0–40)
Albumin/Globulin Ratio: 2.1 (ref 1.2–2.2)
Albumin: 5.2 g/dL (ref 3.5–5.5)
Alkaline Phosphatase: 69 IU/L (ref 39–117)
BUN/Creatinine Ratio: 16 (ref 9–23)
BUN: 12 mg/dL (ref 6–20)
Bilirubin Total: 1.4 mg/dL — ABNORMAL HIGH (ref 0.0–1.2)
CO2: 21 mmol/L (ref 20–29)
Calcium: 9.6 mg/dL (ref 8.7–10.2)
Chloride: 106 mmol/L (ref 96–106)
Creatinine, Ser: 0.77 mg/dL (ref 0.57–1.00)
GFR calc Af Amer: 129 mL/min/{1.73_m2} (ref >59)
GFR calc non Af Amer: 112 mL/min/{1.73_m2} (ref >59)
Globulin, Total: 2.5 g/dL (ref 1.5–4.5)
Glucose: 80 mg/dL (ref 65–99)
Potassium: 4.7 mmol/L (ref 3.5–5.2)
Sodium: 142 mmol/L (ref 134–144)
Total Protein: 7.7 g/dL (ref 6.0–8.5)

## 2018-03-08 LAB — CBC
Hematocrit: 40.1 % (ref 34.0–46.6)
Hemoglobin: 13.4 g/dL (ref 11.1–15.9)
MCH: 27 pg (ref 26.6–33.0)
MCHC: 33.4 g/dL (ref 31.5–35.7)
MCV: 81 fL (ref 79–97)
Platelets: 235 10*3/uL (ref 150–450)
RBC: 4.96 x10E6/uL (ref 3.77–5.28)
RDW: 13.3 % (ref 12.3–15.4)
WBC: 6.9 10*3/uL (ref 3.4–10.8)

## 2018-03-08 LAB — LIPID PANEL
Chol/HDL Ratio: 3 ratio (ref 0.0–4.4)
Cholesterol, Total: 107 mg/dL (ref 100–199)
HDL: 36 mg/dL — ABNORMAL LOW (ref >39)
LDL Calculated: 57 mg/dL (ref 0–99)
Triglycerides: 70 mg/dL (ref 0–149)
VLDL Cholesterol Cal: 14 mg/dL (ref 5–40)

## 2018-03-08 LAB — HEMOGLOBIN A1C
Est. average glucose Bld gHb Est-mCnc: 100 mg/dL
Hgb A1c MFr Bld: 5.1 % (ref 4.8–5.6)

## 2018-03-08 LAB — TSH: TSH: 1.48 u[IU]/mL (ref 0.450–4.500)

## 2018-03-08 NOTE — Telephone Encounter (Signed)
Dr. Lynelle Doctor Do you have any recommendations for hair breaking on ends and general more brittle hair.   We have checked labs, she has even seen dermatology and was told to use some specific vitamins.   (she consumes too much alcohol), but no other obvious cause.  I don't really have any other ideas.  Any thoughts?

## 2018-03-08 NOTE — Telephone Encounter (Signed)
Tried to call pt and let her know that Vickie and shane are ok with pt switching providers, but voicemail was not set, [facility] will be her provider from now on

## 2018-03-08 NOTE — Telephone Encounter (Signed)
I reviewed her recent physical and labs.  No mention of any abnormal exam, labs fine.  Of course there is expected increased hair loss 3-4 months post-partum.  When there is brittle hair/breakage, it is often related to products/treatments that damage the hair, something to discuss with her hairdresser (since the dermatologist apparently didn't find a cause).  Hair/skin/nail vitamins are a good idea.  If damage from treatments/products, would need to wait to see how the new hair grows in (less brittle).   Hope that helps!

## 2018-03-08 NOTE — Telephone Encounter (Signed)
Dr Lynelle Doctor please see Shane's note to you.  Thanks

## 2018-03-08 NOTE — Telephone Encounter (Signed)
done

## 2018-03-11 NOTE — Telephone Encounter (Signed)
Thanks for the reply.  This was actually an issue on another patient.  I am not sure how I attached this message to Mrs. Israelson's chart.   Either way, I appreciate the info.

## 2018-03-18 ENCOUNTER — Ambulatory Visit (INDEPENDENT_AMBULATORY_CARE_PROVIDER_SITE_OTHER): Payer: BLUE CROSS/BLUE SHIELD | Admitting: Orthopedic Surgery

## 2018-03-18 ENCOUNTER — Encounter (INDEPENDENT_AMBULATORY_CARE_PROVIDER_SITE_OTHER): Payer: Self-pay | Admitting: Orthopedic Surgery

## 2018-03-18 DIAGNOSIS — M79601 Pain in right arm: Secondary | ICD-10-CM | POA: Diagnosis not present

## 2018-03-22 ENCOUNTER — Encounter (INDEPENDENT_AMBULATORY_CARE_PROVIDER_SITE_OTHER): Payer: Self-pay | Admitting: Orthopedic Surgery

## 2018-03-23 NOTE — Progress Notes (Signed)
Office Visit Note   Patient: Anne Wood           Date of Birth: 1996-02-14           MRN: [id number] Visit Date: 03/18/2018 Requested by: Jac Canavan, PA-C [address] [place], [place] [address] PCP: Jac Canavan, PA-C  Subjective: Chief Complaint  Patient presents with  . Right Elbow - Pain    HPI: Patient presents with right upper extremity pain which starts in the elbow and radiates to the wrist.  She did reports decreased strength in grip.  She is right-hand dominant.  Denies any neck pain.  Is been going on for many months but worse of the last 3 months.  Had previous injury in 2016 when she was at the beach.  She had to get neck and shoulder injections at that time.  She does describe primarily ulnar nerve symptoms affecting digits 4 and 5.  She also reports decreased grip strength and loss of dexterity.  First noticed it with driving.  Ibuprofen is not been helpful.              ROS: All systems reviewed are negative as they relate to the chief complaint within the history of present illness.  Patient denies  fevers or chills.   Assessment & Plan: Visit Diagnoses:  1. Right arm pain     Plan: Impression is ulnar neuropathy either from the elbow or wrist.  She does have some interosseous weakness as well as dorsal hand numbness.  Plan nerve study to evaluate for carpal tunnel syndrome versus cubital tunnel syndrome.  I will see her back after that study  Follow-Up Instructions: No follow-ups on file.   Orders:  Orders Placed This Encounter  Procedures  . Ambulatory referral to Physical Medicine Rehab   No orders of the defined types were placed in this encounter.     Procedures: No procedures performed   Clinical Data: No additional findings.  Objective: Vital Signs: There were no vitals taken for this visit.  Physical Exam:   Constitutional: Patient appears well-developed HEENT:  Head: Normocephalic Eyes:EOM are normal Neck: Normal  range of motion Cardiovascular: Normal rate Pulmonary/chest: Effort normal Neurologic: Patient is alert Skin: Skin is warm Psychiatric: Patient has normal mood and affect    Ortho Exam: Ortho exam demonstrates good cervical spine range of motion.  Patient has 5 out of 5 grip EPL FPL but less interosseous strength on the right compared to the left.  No interosseous wasting.  Radial pulses intact.  Negative Tinel's cubital tunnel of the elbow bilaterally.  No subluxation of the ulnar nerve.  Does have good wrist extension and flexion strength.  No other masses lymph adenopathy or skin changes noted in the shoulder or wrist region.  Specialty Comments:  No specialty comments available.  Imaging: No results found.   PMFS History: Patient Active Problem List   Diagnosis Date Noted  . Screening for diabetes mellitus 03/07/2018  . Need for influenza vaccination 03/07/2018  . Impingement of right ulnar nerve 03/07/2018  . Gestational hypertension 10/28/2017  . Increased nuchal translucency space on fetal ultrasound 06/15/2017  . [redacted] weeks gestation of pregnancy    Past Medical History:  Diagnosis Date  . Congenital ptosis of both eyelids   . Uterus didelphys     Family History  Problem Relation Age of Onset  . Hypertension Mother   . Hypertension Father   . Diabetes Father   . Diabetes Maternal Grandmother   .  Hypertension Maternal Grandmother   . Hypertension Maternal Grandfather   . Cancer Paternal Grandmother        pancreas  . Heart disease Paternal Grandfather     Past Surgical History:  Procedure Laterality Date  . BLEPHAROPLASTY    . Thyroidglosal duct cyst     Age 72   Social History   Occupational History  . Not on file  Tobacco Use  . Smoking status: Never Smoker  . Smokeless tobacco: Never Used  Substance and Sexual Activity  . Alcohol use: No    Frequency: Never  . Drug use: No  . Sexual activity: Not on file

## 2018-05-14 ENCOUNTER — Ambulatory Visit (INDEPENDENT_AMBULATORY_CARE_PROVIDER_SITE_OTHER): Payer: BLUE CROSS/BLUE SHIELD | Admitting: Physical Medicine and Rehabilitation

## 2018-05-14 ENCOUNTER — Encounter (INDEPENDENT_AMBULATORY_CARE_PROVIDER_SITE_OTHER): Payer: Self-pay | Admitting: Physical Medicine and Rehabilitation

## 2018-05-14 DIAGNOSIS — R202 Paresthesia of skin: Secondary | ICD-10-CM

## 2018-05-14 DIAGNOSIS — R531 Weakness: Secondary | ICD-10-CM

## 2018-05-14 NOTE — Progress Notes (Signed)
Anne FriezeMadisyn Wood - 22 y.o. female MRN [id number]  Date of birth: [birth year]  Office Visit Note: Visit Date: 05/14/2018 PCP: Jac Canavanysinger, David S, PA-C Referred by: Jac Canavanysinger, David S, PA-C  Subjective: Chief Complaint  Patient presents with  . Right Forearm - Pain  . Right Hand - Numbness, Tingling   HPI: Anne Wood is a 22 y.o. female who comes in today At the request of Dr. Burnard BuntingG. Scott Dean for electrodiagnostic study of the right upper limb.  The patient is right-hand dominant and reports pain in the right forearm with numbness and tingling in the right hand and palm in particular the fourth and fifth digits.  She reports this started 3 to 4 years ago.  Is gotten worse with recent pregnancy and delivery.  She reports it continuously is gotten worse.  She reports that she has her right arm propped up it seems to make it worse and sleeping to a degree helps with the symptoms.  Pain levels a 3 out of 10.  She denies any left-sided complaints.  No frank radicular pain or specific trauma.  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 She did report decreased strength in grip.  She is right-hand dominant.  Denies any neck pain.  Is been going on for many months but worse of the last 3 months.   ROS Otherwise per HPI.  Assessment & Plan: Visit Diagnoses:  1. Paresthesia of skin   2. Weakness     Plan: Impression: Essentially NORMAL electrodiagnostic study of the right upper limb.    There is no significant electrodiagnostic evidence of nerve entrapment, brachial plexopathy or cervical radiculopathy.    As you know, purely sensory or demyelinating radiculopathies and chemical radiculitis may not be detected with this particular electrodiagnostic study.  Recommendations: 1.  Follow-up with referring physician. 2.  Continue current management of symptoms.  Meds & Orders: No  orders of the defined types were placed in this encounter.   Orders Placed This Encounter  Procedures  . NCV with EMG (electromyography)    Follow-up: Return for G. Dorene GrebeScott Dean.   Procedures: No procedures performed  EMG & NCV Findings: All nerve conduction studies (as indicated in the following tables) were within normal limits.    All examined muscles (as indicated in the following table) showed no evidence of electrical instability.    Impression: Essentially NORMAL electrodiagnostic study of the right upper limb.    There is no significant electrodiagnostic evidence of nerve entrapment, brachial plexopathy or cervical radiculopathy.    As you know, purely sensory or demyelinating radiculopathies and chemical radiculitis may not be detected with this particular electrodiagnostic study.  Recommendations: 1.  Follow-up with referring physician. 2.  Continue current management of symptoms.  ___________________________ Naaman PlummerFred Harla Mensch FAAPMR Board Certified, American Board of Physical Medicine and Rehabilitation    Nerve Conduction Studies Anti Sensory Summary Table   Stim Site NR Peak (ms) Norm Peak (ms) P-T Amp (V) Norm P-T Amp Site1 Site2 Delta-P (ms) Dist (cm) Vel (m/s) Norm Vel (m/s)  Right Median Acr Palm Anti Sensory (2nd Digit)  32.1C  Wrist    3.1 <3.6 33.8 >10 Wrist Palm 1.3 0.0    Palm    1.8 <2.0 37.3         Right Radial Anti Sensory (Base 1st Digit)  32.1C  Wrist    2.0 <  3.1 20.3  Wrist Base 1st Digit 2.0 0.0    Right Ulnar Anti Sensory (5th Digit)  32.2C  Wrist    3.1 <3.7 38.3 >15.0 Wrist 5th Digit 3.1 14.0 45 >38   Motor Summary Table   Stim Site NR Onset (ms) Norm Onset (ms) O-P Amp (mV) Norm O-P Amp Site1 Site2 Delta-0 (ms) Dist (cm) Vel (m/s) Norm Vel (m/s)  Right Median Motor (Abd Poll Brev)  32.2C  Wrist    3.3 <4.2 8.9 >5 Elbow Wrist 3.5 20.5 59 >50  Elbow    6.8  9.2         Right Ulnar Motor (Abd Dig Min)  32.3C  Wrist    2.7 <4.2 10.7 >3 B  Elbow Wrist 2.9 19.5 67 >53  B Elbow    5.6  10.4  A Elbow B Elbow 1.3 10.0 77 >53  A Elbow    6.9  10.0          EMG   Side Muscle Nerve Root Ins Act Fibs Psw Amp Dur Poly Recrt Int Dennie BiblePat Comment  Right Abd Poll Brev Median C8-T1 Nml Nml Nml Nml Nml 0 Nml Nml   Right 1stDorInt Ulnar C8-T1 Nml Nml Nml Nml Nml 0 Nml Nml   Right PronatorTeres Median C6-7 Nml Nml Nml Nml Nml 0 Nml Nml   Right Biceps Musculocut C5-6 Nml Nml Nml Nml Nml 0 Nml Nml   Right Deltoid Axillary C5-6 Nml Nml Nml Nml Nml 0 Nml Nml     Nerve Conduction Studies Anti Sensory Left/Right Comparison   Stim Site L Lat (ms) R Lat (ms) L-R Lat (ms) L Amp (V) R Amp (V) L-R Amp (%) Site1 Site2 L Vel (m/s) R Vel (m/s) L-R Vel (m/s)  Median Acr Palm Anti Sensory (2nd Digit)  32.1C  Wrist  3.1   33.8  Wrist Palm     Palm  1.8   37.3        Radial Anti Sensory (Base 1st Digit)  32.1C  Wrist  2.0   20.3  Wrist Base 1st Digit     Ulnar Anti Sensory (5th Digit)  32.2C  Wrist  3.1   38.3  Wrist 5th Digit  45    Motor Left/Right Comparison   Stim Site L Lat (ms) R Lat (ms) L-R Lat (ms) L Amp (mV) R Amp (mV) L-R Amp (%) Site1 Site2 L Vel (m/s) R Vel (m/s) L-R Vel (m/s)  Median Motor (Abd Poll Brev)  32.2C  Wrist  3.3   8.9  Elbow Wrist  59   Elbow  6.8   9.2        Ulnar Motor (Abd Dig Min)  32.3C  Wrist  2.7   10.7  B Elbow Wrist  67   B Elbow  5.6   10.4  A Elbow B Elbow  77   A Elbow  6.9   10.0           Waveforms:            Clinical History: No specialty comments available.   She reports that she has never smoked. She has never used smokeless tobacco.  Recent Labs    03/07/18 1347  HGBA1C 5.1    Objective:  VS:  HT:    WT:   BMI:     BP:   HR: bpm  TEMP: ( )  RESP:  Physical Exam Musculoskeletal:        General: No swelling,  tenderness or deformity.     Comments: Inspection reveals no atrophy of the bilateral APB or FDI or hand intrinsics. There is no swelling, color changes, allodynia or  dystrophic changes. There is 5 out of 5 strength in the bilateral wrist extension, finger abduction and long finger flexion. There is intact sensation to light touch in all dermatomal and peripheral nerve distributions. There is a negative Hoffmann's test bilaterally.  Skin:    General: Skin is warm and dry.     Findings: No erythema or rash.  Neurological:     General: No focal deficit present.     Mental Status: She is alert and oriented to person, place, and time.     Motor: No weakness or abnormal muscle tone.     Coordination: Coordination normal.  Psychiatric:        Mood and Affect: Mood normal.        Behavior: Behavior normal.     Ortho Exam Imaging: No results found.  Past Medical/Family/Surgical/Social History: Medications & Allergies reviewed per EMR, new medications updated. Patient Active Problem List   Diagnosis Date Noted  . Screening for diabetes mellitus 03/07/2018  . Need for influenza vaccination 03/07/2018  . Impingement of right ulnar nerve 03/07/2018  . Gestational hypertension 10/28/2017  . Increased nuchal translucency space on fetal ultrasound 06/15/2017  . [redacted] weeks gestation of pregnancy    Past Medical History:  Diagnosis Date  . Congenital ptosis of both eyelids   . Uterus didelphys    Family History  Problem Relation Age of Onset  . Hypertension Mother   . Hypertension Father   . Diabetes Father   . Diabetes Maternal Grandmother   . Hypertension Maternal Grandmother   . Hypertension Maternal Grandfather   . Cancer Paternal Grandmother        pancreas  . Heart disease Paternal Grandfather    Past Surgical History:  Procedure Laterality Date  . BLEPHAROPLASTY    . Thyroidglosal duct cyst     Age 58   Social History   Occupational History  . Not on file  Tobacco Use  . Smoking status: Never Smoker  . Smokeless tobacco: Never Used  Substance and Sexual Activity  . Alcohol use: No    Frequency: Never  . Drug use: No  . Sexual  activity: Not on file

## 2018-05-14 NOTE — Progress Notes (Signed)
  Numeric Pain Rating Scale and Functional Assessment Average Pain 3   In the last MONTH (on 0-10 scale) has pain interfered with the following?  1. General activity like being  able to carry out your everyday physical activities such as walking, climbing stairs, carrying groceries, or moving a chair?  Rating(5)     

## 2018-05-16 NOTE — Procedures (Signed)
EMG & NCV Findings: All nerve conduction studies (as indicated in the following tables) were within normal limits.    All examined muscles (as indicated in the following table) showed no evidence of electrical instability.    Impression: Essentially NORMAL electrodiagnostic study of the right upper limb.    There is no significant electrodiagnostic evidence of nerve entrapment, brachial plexopathy or cervical radiculopathy.    As you know, purely sensory or demyelinating radiculopathies and chemical radiculitis may not be detected with this particular electrodiagnostic study.  Recommendations: 1.  Follow-up with referring physician. 2.  Continue current management of symptoms.  ___________________________ Naaman PlummerFred Mose Colaizzi FAAPMR Board Certified, American Board of Physical Medicine and Rehabilitation    Nerve Conduction Studies Anti Sensory Summary Table   Stim Site NR Peak (ms) Norm Peak (ms) P-T Amp (V) Norm P-T Amp Site1 Site2 Delta-P (ms) Dist (cm) Vel (m/s) Norm Vel (m/s)  Right Median Acr Palm Anti Sensory (2nd Digit)  32.1C  Wrist    3.1 <3.6 33.8 >10 Wrist Palm 1.3 0.0    Palm    1.8 <2.0 37.3         Right Radial Anti Sensory (Base 1st Digit)  32.1C  Wrist    2.0 <3.1 20.3  Wrist Base 1st Digit 2.0 0.0    Right Ulnar Anti Sensory (5th Digit)  32.2C  Wrist    3.1 <3.7 38.3 >15.0 Wrist 5th Digit 3.1 14.0 45 >38   Motor Summary Table   Stim Site NR Onset (ms) Norm Onset (ms) O-P Amp (mV) Norm O-P Amp Site1 Site2 Delta-0 (ms) Dist (cm) Vel (m/s) Norm Vel (m/s)  Right Median Motor (Abd Poll Brev)  32.2C  Wrist    3.3 <4.2 8.9 >5 Elbow Wrist 3.5 20.5 59 >50  Elbow    6.8  9.2         Right Ulnar Motor (Abd Dig Min)  32.3C  Wrist    2.7 <4.2 10.7 >3 B Elbow Wrist 2.9 19.5 67 >53  B Elbow    5.6  10.4  A Elbow B Elbow 1.3 10.0 77 >53  A Elbow    6.9  10.0          EMG   Side Muscle Nerve Root Ins Act Fibs Psw Amp Dur Poly Recrt Int Dennie BiblePat Comment  Right Abd Poll Brev  Median C8-T1 Nml Nml Nml Nml Nml 0 Nml Nml   Right 1stDorInt Ulnar C8-T1 Nml Nml Nml Nml Nml 0 Nml Nml   Right PronatorTeres Median C6-7 Nml Nml Nml Nml Nml 0 Nml Nml   Right Biceps Musculocut C5-6 Nml Nml Nml Nml Nml 0 Nml Nml   Right Deltoid Axillary C5-6 Nml Nml Nml Nml Nml 0 Nml Nml     Nerve Conduction Studies Anti Sensory Left/Right Comparison   Stim Site L Lat (ms) R Lat (ms) L-R Lat (ms) L Amp (V) R Amp (V) L-R Amp (%) Site1 Site2 L Vel (m/s) R Vel (m/s) L-R Vel (m/s)  Median Acr Palm Anti Sensory (2nd Digit)  32.1C  Wrist  3.1   33.8  Wrist Palm     Palm  1.8   37.3        Radial Anti Sensory (Base 1st Digit)  32.1C  Wrist  2.0   20.3  Wrist Base 1st Digit     Ulnar Anti Sensory (5th Digit)  32.2C  Wrist  3.1   38.3  Wrist 5th Digit  45    Motor Left/Right  Comparison   Stim Site L Lat (ms) R Lat (ms) L-R Lat (ms) L Amp (mV) R Amp (mV) L-R Amp (%) Site1 Site2 L Vel (m/s) R Vel (m/s) L-R Vel (m/s)  Median Motor (Abd Poll Brev)  32.2C  Wrist  3.3   8.9  Elbow Wrist  59   Elbow  6.8   9.2        Ulnar Motor (Abd Dig Min)  32.3C  Wrist  2.7   10.7  B Elbow Wrist  67   B Elbow  5.6   10.4  A Elbow B Elbow  77   A Elbow  6.9   10.0           Waveforms:

## 2018-05-17 ENCOUNTER — Ambulatory Visit (INDEPENDENT_AMBULATORY_CARE_PROVIDER_SITE_OTHER): Payer: Self-pay | Admitting: Orthopedic Surgery

## 2018-05-17 ENCOUNTER — Encounter (INDEPENDENT_AMBULATORY_CARE_PROVIDER_SITE_OTHER): Payer: Self-pay

## 2019-05-22 ENCOUNTER — Other Ambulatory Visit: Payer: Self-pay

## 2019-05-22 ENCOUNTER — Ambulatory Visit: Payer: 59 | Admitting: Family Medicine

## 2019-05-22 ENCOUNTER — Encounter: Payer: Self-pay | Admitting: Family Medicine

## 2019-05-22 VITALS — BP 120/70 | HR 111 | Temp 97.7°F | Wt 162.4 lb

## 2019-05-22 DIAGNOSIS — F419 Anxiety disorder, unspecified: Secondary | ICD-10-CM | POA: Diagnosis not present

## 2019-05-22 DIAGNOSIS — F900 Attention-deficit hyperactivity disorder, predominantly inattentive type: Secondary | ICD-10-CM

## 2019-05-22 MED ORDER — METHYLPHENIDATE HCL 10 MG PO TABS: 10.0000 mg | ORAL_TABLET | Freq: Two times a day (BID) | ORAL | 0 refills | Status: DC

## 2019-05-22 NOTE — Progress Notes (Signed)
Subjective:    Patient ID: Anne Wood, female    DOB: Feb 17, 1996, 23 y.o.   MRN: [id number]  HPI Chief Complaint  Patient presents with  . med check    med check. no other concerns   She is a new patient to me today.  She has previously seen my colleague Kristian Covey, [place].  Over the past few months she has actually been a patient at [facility].  States she is switching her care back to my office due to a change in insurance.  States she was diagnosed with ADHD last year at [facility].  Started on ritalin 10 mg in July 2020.  She has been taking this twice daily and doing well. States she takes her first pill around 9 AM and her second dose at 2 PM typically.  States the medication lasts as long as she needs it to.  She denies any issues with sleep.  States appetite is fine.  She is currently doing weight watchers. Her last refill of Ritalin was May 01 2019. States she has medication until 64 or 16th of January.   States her PCP started her on Effexor for anxiety prior to her seeing behavioral specialist at [facility].  States she had testing and was diagnosed with ADHD.  She is no longer taking Effexor.  States anxiety has improved since ADHD has been treated.  States she recalls as a child having issues with focusing.  She reports that she did well in school typically but did not do well with extracurricular activities.  States she got bored very easily.  Denies currently feeling depressed or history of depression.  She married and has a 45-1/2-year-old.  She is in college -online and doing HR courses. Is not working currently.   States her periods are regular. She has a Nexplanon for contraception.  Denies smoking or drug use.  Occasional alcohol use  Denies fever, chills, dizziness, chest pain, palpitations, shortness of breath, abdominal pain, N/V/D, urinary symptoms, LE edema.   BP 120/70   Pulse (!) 111   Temp 97.7 F  (36.5 C)   Wt 162 lb 6.4 oz (73.7 kg)   LMP 05/20/2019   BMI 27.88 kg/m    Review of Systems Pertinent positives and negatives in the history of present illness.     Objective:   Physical Exam BP 120/70   Pulse (!) 111   Temp 97.7 F (36.5 C)   Wt 162 lb 6.4 oz (73.7 kg)   LMP 05/20/2019   BMI 27.88 kg/m   Alert and in no distress. Neck is supple without adenopathy or thyromegaly. Cardiac exam shows a regular sinus rhythm without murmurs or gallops. Lungs are clear to auscultation.  Extremities without edema.  Skin is warm and dry.  Normal mood and thought process.      Assessment & Plan:  Attention deficit hyperactivity disorder (ADHD), predominantly inattentive type - Plan: methylphenidate (RITALIN) 10 MG tablet, methylphenidate (RITALIN) 10 MG tablet, methylphenidate (RITALIN) 10 MG tablet  Anxiety  She is a pleasant 24 year old female who is new to me today.  She is not new to the practice.  Here today to discuss ADHD and needing refills of her Ritalin in the next few days.  She appears to be doing well on twice daily dosing of 10 mg of Ritalin.  No issues with sleep or appetite.  I will refill this for her.  Denies any concerns with anxiety or  depression. I reviewed the PDMP and her refills were consistent with her report and there were no aberrancies. Recommend she return in 3 months for follow-up on ADHD and for her annual CPE.  She does plan on scheduling a visit with her OB/GYN as well.

## 2019-08-19 DIAGNOSIS — F900 Attention-deficit hyperactivity disorder, predominantly inattentive type: Secondary | ICD-10-CM | POA: Insufficient documentation

## 2019-08-19 NOTE — Progress Notes (Signed)
Subjective:    Patient ID: Anne Wood, female    DOB: 22-Mar-1996, 24 y.o.   MRN: [id number]  HPI Chief Complaint  Patient presents with  . cpe     fasting cpe- will make an appt with obgyn, follow-up on ADHD, no other concerns   She is fairly new to me and is here for a complete physical exam. This is our 2nd visit. We are also discussing ADHD and refill of medication.  Recently finished online program in human resources. Looking for a job now.  States she went to a wedding recently in [place] which is where she is from.    Other providers: OB/GYN- plans to schedule with a new one due to insurance.   States she is giving plasma later today.    ADHD- due for refills in one week.  States she was diagnosed with ADHD last year at [facility].  Started on ritalin 10 mg in July 2020.  She has been taking this twice daily and doing well. States she takes her first pill around 9 AM and her second dose at 2 PM typically.  States the medication lasts as long as she needs it to.  She denies any issues with sleep.  States appetite is fine.  She is currently doing weight watchers  Reports currently that anxiety is not an issue. States her PCP started her on Effexor for anxiety prior to her seeing behavioral specialist at [facility].  States she had testing and was diagnosed with ADHD.  She is no longer taking Effexor.  States anxiety has improved since ADHD has been treated.  States she recalls as a child having issues with focusing.  She reports that she did well in school typically but did not do well with extracurricular activities.  States she got bored very easily.  Social history: Lives with her spouse and 9 year old son, recently graduated from [employer] school. Looking for a job now.  Denies smoking or drug use. Social alcohol use.  Diet: she is doing [employer]  Excerise: walks daily   Immunizations: Moderna 1st vaccine received.   Health maintenance:   Mammogram: N/A Colonoscopy: N/A Last Gynecological Exam: almost 2 years ago  Last Menstrual cycle: 08/13/2019 Nexplanon for contraception  Last Dental Exam: 8 months ago  Last Eye Exam: one year ago. Has glasses for driving   Wears seatbelt always, uses sunscreen, smoke detectors in home and functioning, does not text while driving and feels safe in home environment.   Reviewed allergies, medications, past medical, surgical, family, and social history.     Review of Systems Review of Systems Constitutional: -fever, -chills, -sweats, -unexpected weight change,-fatigue ENT: -runny nose, -ear pain, -sore throat Cardiology:  -chest pain, -palpitations, -edema Respiratory: -cough, -shortness of breath, -wheezing Gastroenterology: -abdominal pain, -nausea, -vomiting, -diarrhea, -constipation  Hematology: -bleeding or bruising problems Musculoskeletal: -arthralgias, -myalgias, -joint swelling, -back pain Ophthalmology: -vision changes Urology: -dysuria, -difficulty urinating, -hematuria, -urinary frequency, -urgency Neurology: -headache, -weakness, -tingling, -numbness       Objective:   Physical Exam BP 106/64   Temp 97.7 F (36.5 C)   Ht 5' 6.5" (1.689 m)   Wt 171 lb 3.2 oz (77.7 kg)   LMP 08/13/2019   Breastfeeding No   BMI 27.22 kg/m   General Appearance:    Alert, cooperative, no distress, appears stated age  Head:    Normocephalic, without obvious abnormality, atraumatic  Eyes:    PERRL, conjunctiva/corneas clear, EOM's intact  Ears:    Normal TM's and external ear canals  Nose:   Mask in place   Throat:   Mask in place   Neck:   Supple, no lymphadenopathy;  thyroid:  no   enlargement/tenderness/nodules; no JVD  Back:    Spine nontender, no curvature, ROM normal, no CVA     tenderness  Lungs:     Clear to auscultation bilaterally without wheezes, rales or     ronchi; respirations unlabored  Chest Wall:    No tenderness or deformity   Heart:    Regular rate and  rhythm, S1 and S2 normal, no murmur, rub   or gallop  Breast Exam:    OB/GYN  Abdomen:     Soft, non-tender, nondistended, normoactive bowel sounds,    no masses, no hepatosplenomegaly  Genitalia:    OB/GYN  Rectal:    Not performed due to age<40 and no related complaints  Extremities:   No clubbing, cyanosis or edema  Pulses:   2+ and symmetric all extremities  Skin:   Skin color, texture, turgor normal, no rashes or lesions  Lymph nodes:   Cervical, supraclavicular, and axillary nodes normal  Neurologic:   CNII-XII intact, normal strength, sensation and gait; reflexes 2+ and symmetric throughout          Psych:   Normal mood, affect, hygiene and grooming.         Assessment & Plan:  Routine general medical examination at a health care facility - Plan: CBC with Differential/Platelet, Comprehensive metabolic panel, TSH, T4, free  Attention deficit hyperactivity disorder (ADHD), predominantly inattentive type  Screening for thyroid disorder - Plan: TSH, T4, free  She is here for a fasting CPE as well as to follow-up on ADHD and medication refills. She is in her usual state of health and in good spirits.  No new concerns today. Discussed preventive healthcare and she will call and schedule with an OB/GYN. Counseling on healthy lifestyle including diet and exercise. Discussed safety and health promotion. Congratulated her on getting her first Covid vaccine. She is doing well on Ritalin and dosing it appropriately.  Reviewed PDMP.  I will refill medication for 3 months.  She will follow-up either in office or virtual in 3 months.

## 2019-08-19 NOTE — Patient Instructions (Signed)
Preventive Care 21-23 Years Old, Female Preventive care refers to visits with your health care provider and lifestyle choices that can promote health and wellness. This includes:  A yearly physical exam. This may also be called an annual well check.  Regular dental visits and eye exams.  Immunizations.  Screening for certain conditions.  Healthy lifestyle choices, such as eating a healthy diet, getting regular exercise, not using drugs or products that contain nicotine and tobacco, and limiting alcohol use. What can I expect for my preventive care visit? Physical exam Your health care provider will check your:  Height and weight. This may be used to calculate body mass index (BMI), which tells if you are at a healthy weight.  Heart rate and blood pressure.  Skin for abnormal spots. Counseling Your health care provider may ask you questions about your:  Alcohol, tobacco, and drug use.  Emotional well-being.  Home and relationship well-being.  Sexual activity.  Eating habits.  Work and work environment.  Method of birth control.  Menstrual cycle.  Pregnancy history. What immunizations do I need?  Influenza (flu) vaccine  This is recommended every year. Tetanus, diphtheria, and pertussis (Tdap) vaccine  You may need a Td booster every 10 years. Varicella (chickenpox) vaccine  You may need this if you have not been vaccinated. Human papillomavirus (HPV) vaccine  If recommended by your health care provider, you may need three doses over 6 months. Measles, mumps, and rubella (MMR) vaccine  You may need at least one dose of MMR. You may also need a second dose. Meningococcal conjugate (MenACWY) vaccine  One dose is recommended if you are age 19-21 years and a first-year college student living in a residence hall, or if you have one of several medical conditions. You may also need additional booster doses. Pneumococcal conjugate (PCV13) vaccine  You may need  this if you have certain conditions and were not previously vaccinated. Pneumococcal polysaccharide (PPSV23) vaccine  You may need one or two doses if you smoke cigarettes or if you have certain conditions. Hepatitis A vaccine  You may need this if you have certain conditions or if you travel or work in places where you may be exposed to hepatitis A. Hepatitis B vaccine  You may need this if you have certain conditions or if you travel or work in places where you may be exposed to hepatitis B. Haemophilus influenzae type b (Hib) vaccine  You may need this if you have certain conditions. You may receive vaccines as individual doses or as more than one vaccine together in one shot (combination vaccines). Talk with your health care provider about the risks and benefits of combination vaccines. What tests do I need?  Blood tests  Lipid and cholesterol levels. These may be checked every 5 years starting at age 20.  Hepatitis C test.  Hepatitis B test. Screening  Diabetes screening. This is done by checking your blood sugar (glucose) after you have not eaten for a while (fasting).  Sexually transmitted disease (STD) testing.  BRCA-related cancer screening. This may be done if you have a family history of breast, ovarian, tubal, or peritoneal cancers.  Pelvic exam and Pap test. This may be done every 3 years starting at age 21. Starting at age 30, this may be done every 5 years if you have a Pap test in combination with an HPV test. Talk with your health care provider about your test results, treatment options, and if necessary, the need for more tests.   Follow these instructions at home: Eating and drinking   Eat a diet that includes fresh fruits and vegetables, whole grains, lean protein, and low-fat dairy.  Take vitamin and mineral supplements as recommended by your health care provider.  Do not drink alcohol if: ? Your health care provider tells you not to drink. ? You are  pregnant, may be pregnant, or are planning to become pregnant.  If you drink alcohol: ? Limit how much you have to 0-1 drink a day. ? Be aware of how much alcohol is in your drink. In the [place] one drink equals one 12 oz bottle of beer (355 mL), one 5 oz glass of wine (148 mL), or one 1 oz glass of hard liquor (44 mL). Lifestyle  Take daily care of your teeth and gums.  Stay active. Exercise for at least 30 minutes on 5 or more days each week.  Do not use any products that contain nicotine or tobacco, such as cigarettes, e-cigarettes, and chewing tobacco. If you need help quitting, ask your health care provider.  If you are sexually active, practice safe sex. Use a condom or other form of birth control (contraception) in order to prevent pregnancy and STIs (sexually transmitted infections). If you plan to become pregnant, see your health care provider for a preconception visit. What's next?  Visit your health care provider once a year for a well check visit.  Ask your health care provider how often you should have your eyes and teeth checked.  Stay up to date on all vaccines. This information is not intended to replace advice given to you by your health care provider. Make sure you discuss any questions you have with your health care provider. Document Revised: 01/10/2018 Document Reviewed: 01/10/2018 Elsevier Patient Education  2020 [employer].

## 2019-08-20 ENCOUNTER — Ambulatory Visit: Payer: 59 | Admitting: Family Medicine

## 2019-08-20 ENCOUNTER — Other Ambulatory Visit: Payer: Self-pay

## 2019-08-20 ENCOUNTER — Encounter: Payer: Self-pay | Admitting: Family Medicine

## 2019-08-20 VITALS — BP 106/64 | Temp 97.7°F | Ht 66.5 in | Wt 171.2 lb

## 2019-08-20 DIAGNOSIS — Z0000 Encounter for general adult medical examination without abnormal findings: Secondary | ICD-10-CM | POA: Diagnosis not present

## 2019-08-20 DIAGNOSIS — F900 Attention-deficit hyperactivity disorder, predominantly inattentive type: Secondary | ICD-10-CM | POA: Diagnosis not present

## 2019-08-20 DIAGNOSIS — Z1329 Encounter for screening for other suspected endocrine disorder: Secondary | ICD-10-CM | POA: Diagnosis not present

## 2019-08-21 LAB — COMPREHENSIVE METABOLIC PANEL
ALT: 19 IU/L (ref 0–32)
AST: 21 IU/L (ref 0–40)
Albumin/Globulin Ratio: 2.1 (ref 1.2–2.2)
Albumin: 4.4 g/dL (ref 3.9–5.0)
Alkaline Phosphatase: 66 IU/L (ref 39–117)
BUN/Creatinine Ratio: 18 (ref 9–23)
BUN: 12 mg/dL (ref 6–20)
Bilirubin Total: 1.1 mg/dL (ref 0.0–1.2)
CO2: 19 mmol/L — ABNORMAL LOW (ref 20–29)
Calcium: 8.7 mg/dL (ref 8.7–10.2)
Chloride: 109 mmol/L — ABNORMAL HIGH (ref 96–106)
Creatinine, Ser: 0.68 mg/dL (ref 0.57–1.00)
GFR calc Af Amer: 144 mL/min/{1.73_m2} (ref >59)
GFR calc non Af Amer: 125 mL/min/{1.73_m2} (ref >59)
Globulin, Total: 2.1 g/dL (ref 1.5–4.5)
Glucose: 83 mg/dL (ref 65–99)
Potassium: 3.9 mmol/L (ref 3.5–5.2)
Sodium: 141 mmol/L (ref 134–144)
Total Protein: 6.5 g/dL (ref 6.0–8.5)

## 2019-08-21 LAB — CBC WITH DIFFERENTIAL/PLATELET
Basophils Absolute: 0 10*3/uL (ref 0.0–0.2)
Basos: 0 %
EOS (ABSOLUTE): 0.2 10*3/uL (ref 0.0–0.4)
Eos: 4 %
Hematocrit: 39 % (ref 34.0–46.6)
Hemoglobin: 13.1 g/dL (ref 11.1–15.9)
Immature Grans (Abs): 0 10*3/uL (ref 0.0–0.1)
Immature Granulocytes: 0 %
Lymphocytes Absolute: 1.8 10*3/uL (ref 0.7–3.1)
Lymphs: 34 %
MCH: 28.7 pg (ref 26.6–33.0)
MCHC: 33.6 g/dL (ref 31.5–35.7)
MCV: 85 fL (ref 79–97)
Monocytes Absolute: 0.2 10*3/uL (ref 0.1–0.9)
Monocytes: 5 %
Neutrophils Absolute: 3 10*3/uL (ref 1.4–7.0)
Neutrophils: 57 %
Platelets: 176 10*3/uL (ref 150–450)
RBC: 4.57 x10E6/uL (ref 3.77–5.28)
RDW: 12.8 % (ref 11.7–15.4)
WBC: 5.2 10*3/uL (ref 3.4–10.8)

## 2019-08-21 LAB — T4, FREE: Free T4: 1.16 ng/dL (ref 0.82–1.77)

## 2019-08-21 LAB — TSH: TSH: 1.86 u[IU]/mL (ref 0.450–4.500)

## 2019-08-28 ENCOUNTER — Encounter: Payer: Self-pay | Admitting: Family Medicine

## 2019-08-28 DIAGNOSIS — F900 Attention-deficit hyperactivity disorder, predominantly inattentive type: Secondary | ICD-10-CM

## 2019-08-28 MED ORDER — METHYLPHENIDATE HCL 10 MG PO TABS: 10.0000 mg | ORAL_TABLET | Freq: Two times a day (BID) | ORAL | 0 refills | Status: DC

## 2019-09-29 ENCOUNTER — Telehealth: Payer: Self-pay

## 2019-09-29 NOTE — Telephone Encounter (Signed)
Pt. Called LM stating she needed a refill on her Ritalin to the [facility] on [address]. I called her back and LM stating that she already has a prescription there that was sent on 09/27/19.

## 2019-10-03 ENCOUNTER — Encounter: Payer: Self-pay | Admitting: Family Medicine

## 2019-10-16 ENCOUNTER — Other Ambulatory Visit: Payer: Self-pay

## 2019-10-16 ENCOUNTER — Encounter: Payer: Self-pay | Admitting: Family Medicine

## 2019-10-16 ENCOUNTER — Telehealth: Payer: 59 | Admitting: Family Medicine

## 2019-10-16 VITALS — Temp 100.5°F | Wt 165.0 lb

## 2019-10-16 DIAGNOSIS — R0981 Nasal congestion: Secondary | ICD-10-CM | POA: Diagnosis not present

## 2019-10-16 DIAGNOSIS — R112 Nausea with vomiting, unspecified: Secondary | ICD-10-CM | POA: Diagnosis not present

## 2019-10-16 DIAGNOSIS — R509 Fever, unspecified: Secondary | ICD-10-CM | POA: Diagnosis not present

## 2019-10-16 DIAGNOSIS — J029 Acute pharyngitis, unspecified: Secondary | ICD-10-CM | POA: Diagnosis not present

## 2019-10-16 MED ORDER — ONDANSETRON 4 MG PO TBDP: 4.0000 mg | ORAL_TABLET | Freq: Three times a day (TID) | ORAL | 0 refills | Status: DC | PRN

## 2019-10-16 NOTE — Progress Notes (Signed)
° °  Subjective:  Documentation for virtual audio and video telecommunications through Caregility encounter:  The patient was located at home. 2 patient identifiers used.  The provider was located in the office. The patient did consent to this visit and is aware of possible charges through their insurance for this visit.  The other persons participating in this telemedicine service were none. Time spent on call was 13 minutes and in review of previous records 15 minutes total.  This virtual service is not related to other E/M service within previous 7 days.   Patient ID: Anne Wood, female    DOB: 04/18/1996, 24 y.o.   MRN: [id number]  HPI Chief Complaint  Patient presents with   sore throat    sore throat, vomiting , body aches, fever 100.5, congestion x 3 days. son has URI and ear infection and so she thinks she got it from him   Complains of fever, chills, body aches, fatigue, headache, purulent nasal drainage and congestion, sore throat, dry cough, and N/V/D.  States she is not urinating often and her urine is dark.  Her husband and child are also sick.  Taking Dayquil and Nyquil.   She did get her Covid vaccines.   No dizziness, chest pain, palpitations, shortness of breath, wheezing, abdominal pain.  No loss of taste or smell.   Denies chance of pregnancy and is not breastfeeding.   Reviewed allergies, medications, past medical, surgical, family, and social history.   Review of Systems Pertinent positives and negatives in the history of present illness.     Objective:   Physical Exam Temp (!) 100.5 F (38.1 C)    Wt 165 lb (74.8 kg)    BMI 26.23 kg/m   Alert and oriented. She is laying in her bed during the visit. Respirations unlabored. Speaking in complete sentences without difficulty. Normal speech, mood and thought process.       Assessment & Plan:  Nausea and vomiting, intractability of vomiting not specified, unspecified vomiting type - Plan:  ondansetron (ZOFRAN ODT) 4 MG disintegrating tablet  Fever and chills  Acute pharyngitis, unspecified etiology  Nasal congestion  Discussed limitations of a virtual visit. No acute distress but she has multiple symptoms and is at risk for dehydration due to N/V/D and decreased oral intake.  Zofran prescribed. She was advised to push fluids and if she is unable to do this or to keep them down, she will need to go to the ED.  She will get a Covid test and let me know her result.  Possible strep pharyngitis vs sinusitis but she has several GI symptoms suggesting a viral etiology.   Counseling on supportive care.  Follow up once she has her Covid test via telephone. I will have a low threshold to send in antibiotics.

## 2019-10-20 ENCOUNTER — Encounter: Payer: Self-pay | Admitting: Family Medicine

## 2019-10-20 MED ORDER — AMOXICILLIN 875 MG PO TABS: 875.0000 mg | ORAL_TABLET | Freq: Two times a day (BID) | ORAL | 0 refills | Status: DC

## 2019-11-20 ENCOUNTER — Encounter: Payer: Self-pay | Admitting: Family Medicine

## 2019-11-26 ENCOUNTER — Telehealth: Payer: 59 | Admitting: Family Medicine

## 2019-11-26 ENCOUNTER — Other Ambulatory Visit: Payer: Self-pay

## 2019-11-26 VITALS — Temp 98.8°F | Wt 165.0 lb

## 2019-11-26 DIAGNOSIS — F900 Attention-deficit hyperactivity disorder, predominantly inattentive type: Secondary | ICD-10-CM | POA: Diagnosis not present

## 2019-11-26 MED ORDER — METHYLPHENIDATE HCL 20 MG PO TABS: 20.0000 mg | ORAL_TABLET | Freq: Two times a day (BID) | ORAL | 0 refills | Status: DC

## 2019-11-26 NOTE — Progress Notes (Signed)
   Subjective:  Documentation for virtual audio and video telecommunications through Caregility encounter:  The patient was located at home. 2 patient identifiers used.  The provider was located in the office. The patient did consent to this visit and is aware of possible charges through their insurance for this visit.  The other persons participating in this telemedicine service were none. Time spent on call was 14 minutes and in review of previous records 20 minutes total.  This virtual service is not related to other E/M service within previous 7 days.   Patient ID: Anne Wood, female    DOB: 06-Apr-1996, 24 y.o.   MRN: [id number]  HPI Chief Complaint  Patient presents with  . ADD Meds    ADD meds   This is a follow up on ADD and for refills of Ritalin. Reports the medication does not seem to be working very long for her. 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  States she takes the first dose around 6:30 am. States it wears off around 8:30 am and then she tries to hold off on until noon before taking the next one. Her job requires her to be able to focus until at least 4 pm. States she can definitely tell the medication works but is not lasting long enough.    States she has tried Ritalin XR prior to coming to me and she could not tell that it was working. It was also a low dose.   She went to a psychologist and had thorough testing over a course of 2 months.   States she was diagnosed with ADHD last year at [facility].  Started on ritalin 10 mgin July 2020.  She denies any issues with sleep. States appetite is fine. She is currently doing weight watchers  Reports currently that anxiety is not an issue. States her PCP started her on Effexor for anxiety prior to her seeing behavioral specialist at [facility]. States she had testing and was diagnosed with ADHD. She is no  longer taking Effexor. States anxiety has improved since ADHD has been treated.  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 She reports that she did well in school typically but did not do well with extracurricular activities. States she got bored very easily.  No other concerns today.  No fever, chills, headache, cough, abdominal pain, N/V/D.   Reviewed allergies, medications, past medical, surgical, family, and social history.    Review of Systems Pertinent positives and negatives in the history of present illness.     Objective:   Physical Exam Temp 98.8 F (37.1 C)   Wt 165 lb (74.8 kg)   BMI 26.23 kg/m   Alert and oriented and in no acute distress. Not otherwise examined.       Assessment & Plan:  Attention deficit hyperactivity disorder (ADHD), predominantly inattentive type - Plan: methylphenidate (RITALIN) 20 MG tablet  Ritalin 10 mg is not working long enough but she can tell that it works while in her system. Discussed increasing her dose vs switching to extended release vs switching her to Adderall. She has tried extended release reportedly and it did not help her but this was a low dose. We decided to try increasing her Ritalin dose to 20 mg for the next month.  She will follow up with me in 3-4 weeks or sooner if needed. Reviewed PDMP.

## 2019-12-24 ENCOUNTER — Telehealth: Payer: 59 | Admitting: Family Medicine

## 2019-12-24 ENCOUNTER — Encounter: Payer: Self-pay | Admitting: Family Medicine

## 2019-12-24 ENCOUNTER — Other Ambulatory Visit: Payer: Self-pay

## 2019-12-24 VITALS — Temp 98.2°F | Wt 165.0 lb

## 2019-12-24 DIAGNOSIS — F900 Attention-deficit hyperactivity disorder, predominantly inattentive type: Secondary | ICD-10-CM

## 2019-12-24 MED ORDER — METHYLPHENIDATE HCL 20 MG PO TABS: 20.0000 mg | ORAL_TABLET | Freq: Two times a day (BID) | ORAL | 0 refills | Status: DC

## 2019-12-24 NOTE — Progress Notes (Signed)
   Subjective:  Documentation for virtual audio and video telecommunications through Caregility encounter:  The patient was located at home. 2 patient identifiers used.  The provider was located in the office. The patient did consent to this visit and is aware of possible charges through their insurance for this visit.  The other persons participating in this telemedicine service were none. Time spent on call was 10 minutes and in review of previous records 15 minutes total.  This virtual service is not related to other E/M service within previous 7 days.   Patient ID: Anne Wood, female    DOB: May 03, 1996, 24 y.o.   MRN: [id number]  HPI Chief Complaint  Patient presents with  . follow-up    follow-up on ritalin   This is a virtual follow up on ADD. She was taking 10 mg of Ritalin bid and reported the medication did not last long enough with her new job. We increased her dose and she has been taking 20 mg of Ritalin bid. Last filled on 11/27/2019.  States the increased dose seems to be lasting longer and does last as long as she needs it to.  She occasionally does not have to take the afternoon dose of Ritalin.  Denies any issues with appetite or sleep.  States she is pleased with the current dosing. No new concerns.  Of note: States she has tried Ritalin XR prior to coming to me and she could not tell that it was working. It was also a low dose.   She went to a psychologist and had thorough testing for ADD over a course of 2 months.   States she was diagnosed with ADHD last year at [facility].  Started on ritalin 10 mgin July 2020.  She denies any issues with sleep. States appetite is fine. She is currently doing weight watchers  Reports currently that anxiety is not an issue.States her PCP started her on Effexor for anxiety prior to her seeing behavioral specialist at [facility]. States she had testing and was diagnosed with ADHD.  She is no longer taking Effexor. States anxiety has improved since ADHD has been treated.  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 She reports that she did well in school typically but did not do well with extracurricular activities. States she got bored very easily.   Review of Systems Pertinent positives and negatives in the history of present illness.     Objective:   Physical Exam Temp 98.2 F (36.8 C)   Wt 165 lb (74.8 kg)   BMI 26.23 kg/m   Alert and oriented and in no acute distress.  Respirations unlabored.  Normal speech, mood and thought process.      Assessment & Plan:  Attention deficit hyperactivity disorder (ADHD), predominantly inattentive type - Plan: methylphenidate (RITALIN) 20 MG tablet  She is doing well on 20 mg of Ritalin and would like to continue on this dose.  The medication is now lasting throughout the day for her, as long as she needs it.  No issues with appetite or sleep.  PDMP reviewed and no aberrancies.  She would prefer a 1 month refill and then send me a MyChart message when she is due for her next refill.  Offered a 66-month refill.  Up-to-date on CPE.

## 2020-01-26 ENCOUNTER — Other Ambulatory Visit: Payer: Self-pay | Admitting: Family Medicine

## 2020-01-26 ENCOUNTER — Encounter: Payer: Self-pay | Admitting: Family Medicine

## 2020-01-26 DIAGNOSIS — F900 Attention-deficit hyperactivity disorder, predominantly inattentive type: Secondary | ICD-10-CM

## 2020-01-26 MED ORDER — METHYLPHENIDATE HCL 20 MG PO TABS: 20.0000 mg | ORAL_TABLET | Freq: Two times a day (BID) | ORAL | 0 refills | Status: DC

## 2020-02-17 ENCOUNTER — Encounter: Payer: Self-pay | Admitting: Family Medicine

## 2020-02-19 ENCOUNTER — Telehealth: Payer: 59 | Admitting: Family Medicine

## 2020-02-19 ENCOUNTER — Encounter: Payer: Self-pay | Admitting: Family Medicine

## 2020-02-19 VITALS — Temp 98.2°F | Wt 180.0 lb

## 2020-02-19 DIAGNOSIS — Z713 Dietary counseling and surveillance: Secondary | ICD-10-CM

## 2020-02-19 NOTE — Progress Notes (Signed)
   Subjective:    Patient ID: Anne Wood, female    DOB: March 28, 1996, 24 y.o.   MRN: [id number]  HPI Chief Complaint  Patient presents with  . f/u    follow-up on weight loss. talk about options    She would like to discuss weight loss medications.   States she tried [employer]. States she was losing weight and got down to 160 lbs  States she has gained 20 lbs over the past 3-4 months.   She tried intermittent fasting and it did not work. Has tried keto diet and my Fitness Pal.   She has been walking and doing Taekwondo.   Drinks a glass of water before eating.    Eats 3 meals. Late night snack is 45 cal ice pops or fruit.  Gets up at 6:45 am and does not eat until 9 am.   Denies fever, chills, dizziness, chest pain, palpitations, shortness of breath, abdominal pain, N/V/D.   Reviewed allergies, medications, past medical, surgical, family, and social history.    Review of Systems Pertinent positives and negatives in the history of present illness.     Objective:   Physical Exam Temp 98.2 F (36.8 C)   Wt 180 lb (81.6 kg)   BMI 28.62 kg/m   Alert and oriented and in no acute distress.       Assessment & Plan:  Encounter for weight loss counseling  She is aware that weight loss medication such as Wegovy or Bernie Covey will most likely not be covered for her. Discussed that she does not appear to be overeating but perhaps making choices that are high in sugar and carbohydrates. Counseling done on healthy food choices and time of day to eat.  Encouraged increasing her physical activity and trying HIIT.  She will follow up with me and I have challenged her to losing 1 lb per week over the next 2-3 months.

## 2020-02-25 ENCOUNTER — Encounter: Payer: Self-pay | Admitting: Family Medicine

## 2020-02-25 ENCOUNTER — Other Ambulatory Visit: Payer: Self-pay | Admitting: Family Medicine

## 2020-02-25 DIAGNOSIS — F900 Attention-deficit hyperactivity disorder, predominantly inattentive type: Secondary | ICD-10-CM

## 2020-02-25 MED ORDER — METHYLPHENIDATE HCL 20 MG PO TABS: 20.0000 mg | ORAL_TABLET | Freq: Two times a day (BID) | ORAL | 0 refills | Status: DC

## 2020-03-25 ENCOUNTER — Encounter: Payer: Self-pay | Admitting: Family Medicine

## 2020-03-25 ENCOUNTER — Other Ambulatory Visit: Payer: Self-pay | Admitting: Family Medicine

## 2020-03-25 DIAGNOSIS — F900 Attention-deficit hyperactivity disorder, predominantly inattentive type: Secondary | ICD-10-CM

## 2020-03-25 MED ORDER — METHYLPHENIDATE HCL 20 MG PO TABS: 20.0000 mg | ORAL_TABLET | Freq: Two times a day (BID) | ORAL | 0 refills | Status: DC

## 2020-04-23 ENCOUNTER — Encounter: Payer: Self-pay | Admitting: Family Medicine

## 2020-04-24 ENCOUNTER — Other Ambulatory Visit: Payer: Self-pay | Admitting: Family Medicine

## 2020-04-24 DIAGNOSIS — F900 Attention-deficit hyperactivity disorder, predominantly inattentive type: Secondary | ICD-10-CM

## 2020-04-24 MED ORDER — METHYLPHENIDATE HCL 20 MG PO TABS: 20.0000 mg | ORAL_TABLET | Freq: Two times a day (BID) | ORAL | 0 refills | Status: DC

## 2020-05-24 ENCOUNTER — Encounter: Payer: Self-pay | Admitting: Family Medicine

## 2020-05-24 DIAGNOSIS — F900 Attention-deficit hyperactivity disorder, predominantly inattentive type: Secondary | ICD-10-CM

## 2020-05-24 MED ORDER — METHYLPHENIDATE HCL 20 MG PO TABS: 20.0000 mg | ORAL_TABLET | Freq: Two times a day (BID) | ORAL | 0 refills | Status: DC

## 2020-06-23 ENCOUNTER — Encounter: Payer: Self-pay | Admitting: Family Medicine

## 2020-06-24 ENCOUNTER — Encounter: Payer: Self-pay | Admitting: Family Medicine

## 2020-06-24 ENCOUNTER — Telehealth (INDEPENDENT_AMBULATORY_CARE_PROVIDER_SITE_OTHER): Payer: 59 | Admitting: Family Medicine

## 2020-06-24 VITALS — Temp 98.0°F | Ht 65.0 in | Wt 170.0 lb

## 2020-06-24 DIAGNOSIS — F900 Attention-deficit hyperactivity disorder, predominantly inattentive type: Secondary | ICD-10-CM

## 2020-06-24 MED ORDER — METHYLPHENIDATE HCL 20 MG PO TABS: 20.0000 mg | ORAL_TABLET | Freq: Two times a day (BID) | ORAL | 0 refills | Status: DC

## 2020-06-24 NOTE — Progress Notes (Signed)
   Subjective:  Documentation for virtual audio and video telecommunications through Caregility encounter:  The patient was located at home. 2 patient identifiers used.  The provider was located in the office. The patient did consent to this visit and is aware of possible charges through their insurance for this visit.  The other persons participating in this telemedicine service were none. Time spent on call was 15 minutes and in review of previous records 20 minutes total.  This virtual service is not related to other E/M service within previous 7 days.   Patient ID: Anne Wood, female    DOB: January 22, 1996, 25 y.o.   MRN: [id number]  HPI Chief Complaint  Patient presents with  . ADHD    Need refill on ADHD medications   States she has been taking Ritalin 20 mg twice daily as prescribed and is doing well.  States the medicine helps her focus, staying on task and organized.  States she has difficulty if she does not take it. States she takes her first dose around 7 am States she can tell the medication is wearing off by feeling a little "draggy".  States she feels like she needs to drink a lot of caffeine when the medicine wears off.  States she takes her 2nd dose after lunch and it works until 6 or 7 pm.   She exercises around that time so she does not notice the medication wearing off.  No issues with sleep or appetite.  States she is eating 3 meals per day.   No other concerns or complaints.  Denies fever, chills, headache, dizziness, chest pain, abdominal pain, nausea, vomiting or diarrhea.  She has the Nexplanon for contraception.     Review of Systems Pertinent positives and negatives in the history of present illness.     Objective:   Physical Exam Temp 98 F (36.7 C)   Ht 5\' 5"  (1.651 m)   Wt 170 lb (77.1 kg)   BMI 28.29 kg/m    Alert and oriented in no acute distress.  Respirations unlabored.  Normal speech, mood and thought process.     Assessment  & Plan:  Attention deficit hyperactivity disorder (ADHD), predominantly inattentive type - Plan: methylphenidate (RITALIN) 20 MG tablet  She is doing well and taking her Ritalin appropriately.  Reviewed PDMP.  No side effects.  I will refill her medication for 1 month.  She will see me a MyChart message when she is due for refills.  Discussed following up again in 3 months.

## 2020-07-20 ENCOUNTER — Telehealth (INDEPENDENT_AMBULATORY_CARE_PROVIDER_SITE_OTHER): Payer: 59 | Admitting: Medical

## 2020-07-20 ENCOUNTER — Encounter: Payer: Self-pay | Admitting: Medical

## 2020-07-20 ENCOUNTER — Other Ambulatory Visit: Payer: Self-pay

## 2020-07-20 VITALS — Ht 65.0 in | Wt 170.0 lb

## 2020-07-20 DIAGNOSIS — R0602 Shortness of breath: Secondary | ICD-10-CM | POA: Diagnosis not present

## 2020-07-20 DIAGNOSIS — J988 Other specified respiratory disorders: Secondary | ICD-10-CM

## 2020-07-20 DIAGNOSIS — R059 Cough, unspecified: Secondary | ICD-10-CM | POA: Diagnosis not present

## 2020-07-20 MED ORDER — ALBUTEROL SULFATE HFA 108 (90 BASE) MCG/ACT IN AERS: 2.0000 | INHALATION_SPRAY | Freq: Four times a day (QID) | RESPIRATORY_TRACT | 0 refills | Status: AC | PRN

## 2020-07-20 NOTE — Patient Instructions (Signed)
We discussed limitations of virtual consult.   Your symptoms suggest a respiratory tract infection.  Continue Robitussin-DM for cough and congestion, rest, hydrate well throughout the day with water and clear fluids.  Begin Albuterol inhaler, 1-2 puffs every 4-6 hours for shortness of breath, wheezing, or tightness in chest.    If worse in the next few days, not improving, or if fever over 101, worse shortness of breath, thick mucous coughing up or other, then call back.   Next steps would be chest xray or other evaluation.  If you resting pulse is over 100, then let me know.    If much worse in the next 72 hours, call, recheck or go to the emergency department.

## 2020-07-20 NOTE — Progress Notes (Signed)
Subjective:     Patient ID: Anne Wood, female   DOB: April 16, 1996, 25 y.o.   MRN: [id number]  This visit type was conducted due to national recommendations for restrictions regarding the COVID-19 Pandemic (e.g. social distancing) in an effort to limit this patient's exposure and mitigate transmission in our community.  Due to their co-morbid illnesses, this patient is at least at moderate risk for complications without adequate follow up.  This format is felt to be most appropriate for this patient at this time.    Documentation for virtual audio and video telecommunications through [place] encounter:  The patient was located at home. The provider was located in the office. The patient did consent to this visit and is aware of possible charges through their insurance for this visit.  The other persons participating in this telemedicine service were none. Time spent on call was 20 minutes and in review of previous records 20 minutes total.  This virtual service is not related to other E/M service within previous 7 days.   HPI Chief Complaint  Patient presents with  . Cough    With SOB and congestion. Covid test was negative on yesterday. Had at home test    Virtual consult for SOB.  Started with head cold x 3 days, been using OTC medication.  Last night awoke early in morning, felt some hard to breath, cramp in side.  Felt like she wasn't getting in enough air.  Tried Robitussin DM to help.   Feels like wheeze or gargle with breathing.  No hx/o asthma.   Nonsmoker.   Husband has had head cold as well.  No pain in calves, no lower leg swelling. No recent long travel, injury or surgery. No prior heart or lung issues.  Did home covid test yesterday and this negative.  No recent covid contacts.  Works from home.  Has had cough, sinus drainage, headache. No sore throat, felt hot but no documented fever, some chills, no body aches. No nausea or vomiting.   No loss of smell or taste.  No  prior DVT or PE.  Exercises regularly 3 days per week with tae kwon do.    Has had moderna vaccine x 2.   Past Medical History:  Diagnosis Date  . Congenital ptosis of both eyelids   . Uterus didelphys     Review of Systems As in subjective    Objective:   Physical Exam Due to coronavirus pandemic stay at home measures, patient visit was virtual and they were not examined in person.   Gen: wd, wn, nad, well appearing Occasional couhg, no obvious wheezing or labored breathing      Assessment:     Encounter Diagnoses  Name Primary?  . Cough Yes  . SOB (shortness of breath)   . Respiratory tract infection        Plan:     We discussed limitations of virtual consult.   Your symptoms suggest a respiratory tract infection.  Continue Robitussin-DM for cough and congestion, rest, hydrate well throughout the day with water and clear fluids.  Begin Albuterol inhaler, 1-2 puffs every 4-6 hours for shortness of breath, wheezing, or tightness in chest.    If worse in the next few days, not improving, or if fever over 101, worse shortness of breath, thick mucous coughing up or other, then call back.   Next steps would be chest xray or other evaluation.  If you resting pulse is over 100, then let me know.  If much worse in the next 72 hours, call, recheck or go to the emergency department.    Ellis was seen today for cough.  Diagnoses and all orders for this visit:  Cough  SOB (shortness of breath)  Respiratory tract infection  Other orders -     albuterol (VENTOLIN HFA) 108 (90 Base) MCG/ACT inhaler; Inhale 2 puffs into the lungs every 6 (six) hours as needed for wheezing or shortness of breath.  f/u prn

## 2020-07-20 NOTE — Progress Notes (Signed)
Done

## 2020-07-23 ENCOUNTER — Encounter: Payer: Self-pay | Admitting: Family Medicine

## 2020-07-23 ENCOUNTER — Other Ambulatory Visit: Payer: Self-pay | Admitting: Medical

## 2020-07-23 DIAGNOSIS — F900 Attention-deficit hyperactivity disorder, predominantly inattentive type: Secondary | ICD-10-CM

## 2020-07-23 MED ORDER — METHYLPHENIDATE HCL 20 MG PO TABS: 20.0000 mg | ORAL_TABLET | Freq: Two times a day (BID) | ORAL | 0 refills | Status: DC

## 2020-08-23 ENCOUNTER — Encounter: Payer: Self-pay | Admitting: Family Medicine

## 2020-08-23 ENCOUNTER — Encounter: Payer: Self-pay | Admitting: Internal Medicine

## 2020-08-24 ENCOUNTER — Other Ambulatory Visit: Payer: Self-pay | Admitting: Family Medicine

## 2020-08-24 DIAGNOSIS — F900 Attention-deficit hyperactivity disorder, predominantly inattentive type: Secondary | ICD-10-CM

## 2020-08-24 MED ORDER — METHYLPHENIDATE HCL 20 MG PO TABS: 20.0000 mg | ORAL_TABLET | Freq: Two times a day (BID) | ORAL | 0 refills | Status: DC

## 2020-09-13 LAB — HM PAP SMEAR: HM Pap smear: NEGATIVE

## 2020-09-13 LAB — RESULTS CONSOLE HPV: CHL HPV: NEGATIVE

## 2020-09-16 ENCOUNTER — Encounter: Payer: Self-pay | Admitting: Internal Medicine

## 2020-09-21 ENCOUNTER — Encounter: Payer: Self-pay | Admitting: Family Medicine

## 2020-09-22 ENCOUNTER — Encounter: Payer: Self-pay | Admitting: Family Medicine

## 2020-09-22 ENCOUNTER — Other Ambulatory Visit: Payer: Self-pay | Admitting: Family Medicine

## 2020-09-22 DIAGNOSIS — F900 Attention-deficit hyperactivity disorder, predominantly inattentive type: Secondary | ICD-10-CM

## 2020-09-22 MED ORDER — METHYLPHENIDATE HCL 20 MG PO TABS: 20.0000 mg | ORAL_TABLET | Freq: Two times a day (BID) | ORAL | 0 refills | Status: DC

## 2020-10-22 ENCOUNTER — Other Ambulatory Visit: Payer: Self-pay | Admitting: Family Medicine

## 2020-10-22 DIAGNOSIS — F900 Attention-deficit hyperactivity disorder, predominantly inattentive type: Secondary | ICD-10-CM

## 2020-10-22 MED ORDER — METHYLPHENIDATE HCL 20 MG PO TABS: 20.0000 mg | ORAL_TABLET | Freq: Two times a day (BID) | ORAL | 0 refills | Status: DC

## 2020-11-10 ENCOUNTER — Encounter: Payer: Self-pay | Admitting: Internal Medicine

## 2020-11-17 ENCOUNTER — Telehealth: Payer: Self-pay | Admitting: Family Medicine

## 2020-11-17 DIAGNOSIS — F900 Attention-deficit hyperactivity disorder, predominantly inattentive type: Secondary | ICD-10-CM

## 2020-11-17 MED ORDER — METHYLPHENIDATE HCL 20 MG PO TABS: 20.0000 mg | ORAL_TABLET | Freq: Two times a day (BID) | ORAL | 0 refills | Status: DC

## 2020-11-17 NOTE — Telephone Encounter (Signed)
Pt called for refills of ritalin. Pt aware not to be picked up until the 12th. Pt uses [facility] on [address]. Pt can be reached at [phone].

## 2020-11-23 ENCOUNTER — Other Ambulatory Visit: Payer: Self-pay | Admitting: Family Medicine

## 2020-11-23 DIAGNOSIS — F900 Attention-deficit hyperactivity disorder, predominantly inattentive type: Secondary | ICD-10-CM

## 2020-11-23 MED ORDER — METHYLPHENIDATE HCL 20 MG PO TABS: 20.0000 mg | ORAL_TABLET | Freq: Two times a day (BID) | ORAL | 0 refills | Status: DC

## 2020-11-30 ENCOUNTER — Encounter: Payer: 59 | Admitting: Family Medicine

## 2020-12-13 ENCOUNTER — Telehealth: Payer: Self-pay

## 2020-12-13 DIAGNOSIS — F900 Attention-deficit hyperactivity disorder, predominantly inattentive type: Secondary | ICD-10-CM

## 2020-12-13 MED ORDER — METHYLPHENIDATE HCL 20 MG PO TABS: 20.0000 mg | ORAL_TABLET | Freq: Two times a day (BID) | ORAL | 0 refills | Status: DC

## 2020-12-13 NOTE — Telephone Encounter (Signed)
Pt states she is going on vacation and would like to get her Ritalin a week early on 12/17/20 if possible please send to [facility]

## 2021-01-23 ENCOUNTER — Other Ambulatory Visit: Payer: Self-pay | Admitting: Family Medicine

## 2021-01-23 DIAGNOSIS — F900 Attention-deficit hyperactivity disorder, predominantly inattentive type: Secondary | ICD-10-CM

## 2021-01-24 MED ORDER — METHYLPHENIDATE HCL 20 MG PO TABS: 20.0000 mg | ORAL_TABLET | Freq: Two times a day (BID) | ORAL | 0 refills | Status: DC

## 2021-02-23 ENCOUNTER — Other Ambulatory Visit: Payer: Self-pay | Admitting: Family Medicine

## 2021-02-23 DIAGNOSIS — F900 Attention-deficit hyperactivity disorder, predominantly inattentive type: Secondary | ICD-10-CM

## 2021-02-23 MED ORDER — METHYLPHENIDATE HCL 20 MG PO TABS: 20.0000 mg | ORAL_TABLET | Freq: Two times a day (BID) | ORAL | 0 refills | Status: DC

## 2021-03-24 ENCOUNTER — Other Ambulatory Visit: Payer: Self-pay | Admitting: Family Medicine

## 2021-03-24 DIAGNOSIS — F900 Attention-deficit hyperactivity disorder, predominantly inattentive type: Secondary | ICD-10-CM

## 2021-03-24 MED ORDER — METHYLPHENIDATE HCL 20 MG PO TABS: 20.0000 mg | ORAL_TABLET | Freq: Two times a day (BID) | ORAL | 0 refills | Status: AC

## 2021-04-19 ENCOUNTER — Encounter: Payer: Self-pay | Admitting: Family Medicine

## 2021-04-19 ENCOUNTER — Telehealth (INDEPENDENT_AMBULATORY_CARE_PROVIDER_SITE_OTHER): Payer: 59 | Admitting: Family Medicine

## 2021-04-19 ENCOUNTER — Other Ambulatory Visit: Payer: Self-pay

## 2021-04-19 ENCOUNTER — Other Ambulatory Visit (INDEPENDENT_AMBULATORY_CARE_PROVIDER_SITE_OTHER): Payer: 59

## 2021-04-19 VITALS — Temp 99.5°F | Wt 170.0 lb

## 2021-04-19 DIAGNOSIS — J029 Acute pharyngitis, unspecified: Secondary | ICD-10-CM | POA: Diagnosis not present

## 2021-04-19 LAB — POCT RAPID STREP A (OFFICE): Rapid Strep A Screen: NEGATIVE

## 2021-04-19 NOTE — Progress Notes (Signed)
   Subjective:    Patient ID: Anne Wood, female    DOB: 02/28/1996, 25 y.o.   MRN: [id number]  HPI Documentation for virtual audio and video telecommunications through Caregility encounter: The patient was located at home. 2 patient identifiers used.  The provider was located in the office. The patient did consent to this visit and is aware of possible charges through their insurance for this visit. The other persons participating in this telemedicine service were none. Time spent on call was 5 minutes and in review of previous records >17 minutes total for counseling and coordination of care. This virtual service is not related to other E/M service within previous 7 days.  She has a 3-day history of started with arthralgias followed by fever up to 102, sore throat, slight nausea and no coughing.  Her 56-year-old son has had children n his daycare with the flu, strep and RSV.  He presently is having no symptoms.  Review of Systems     Objective:   Physical Exam Alert and in no distress       Assessment & Plan:  Sore throat - Plan: Rapid Strep A Strep is negative.  Recommend fluids, Tylenol and other symptomatic care.

## 2021-06-13 ENCOUNTER — Encounter: Payer: Self-pay | Admitting: Family Medicine

## 2021-07-14 NOTE — Progress Notes (Deleted)
? ?Acute Office Visit ? ?Subjective:  ? ? Patient ID: Anne Wood, female    DOB: 04/22/1996, 26 y.o.   MRN: [id number] ? ?No chief complaint on file. ? ? ?HPI ?Patient is in today for *** ? ?Past Medical History:  ?Diagnosis Date  ? Congenital ptosis of both eyelids   ? Uterus didelphys   ? ? ?Past Surgical History:  ?Procedure Laterality Date  ? BLEPHAROPLASTY    ? Thyroidglosal duct cyst    ? Age 52  ? ? ?Family History  ?Problem Relation Age of Onset  ? Hypertension Mother   ? Hypertension Father   ? Diabetes Father   ? Diabetes Maternal Grandmother   ? Hypertension Maternal Grandmother   ? Hypertension Maternal Grandfather   ? Cancer Paternal Grandmother   ?     pancreas  ? Heart disease Paternal Grandfather   ? ? ?Social History  ? ?Socioeconomic History  ? Marital status: Married  ?  Spouse name: Not on file  ? Number of children: Not on file  ? Years of education: Not on file  ? Highest education level: Not on file  ?Occupational History  ? Not on file  ?Tobacco Use  ? Smoking status: Never  ? Smokeless tobacco: Never  ?Vaping Use  ? Vaping Use: Never used  ?Substance and Sexual Activity  ? Alcohol use: Yes  ?  Comment: occasional  ? Drug use: No  ? Sexual activity: Yes  ?  Partners: Male  ?  Birth control/protection: Implant  ?Other Topics Concern  ? Not on file  ?Social History Narrative  ? Married, 83mo old baby, exercise some, stay at home mom.   02/2018.  ? ?Social Determinants of Health  ? ?Financial Resource Strain: Not on file  ?Food Insecurity: Not on file  ?Transportation Needs: Not on file  ?Physical Activity: Not on file  ?Stress: Not on file  ?Social Connections: Not on file  ?Intimate Partner Violence: Not on file  ? ? ?Outpatient Medications Prior to Visit  ?Medication Sig Dispense Refill  ? albuterol (VENTOLIN HFA) 108 (90 Base) MCG/ACT inhaler Inhale 2 puffs into the lungs every 6 (six) hours as needed for wheezing or shortness of breath. (Patient not taking: Reported on 04/19/2021) 8 g 0   ? etonogestrel (NEXPLANON) 68 MG IMPL implant by Subdermal route. 34MO as of 06/27/2018    ? ibuprofen (ADVIL) 400 MG tablet Take by mouth every 6 (six) hours as needed. (Patient not taking: Reported on 07/20/2020)    ? methylphenidate (RITALIN) 20 MG tablet Take 1 tablet (20 mg total) by mouth 2 (two) times daily. 60 tablet 0  ? ?No facility-administered medications prior to visit.  ? ? ?Allergies  ?Allergen Reactions  ? Pseudoephedrine Hives  ? Azithromycin   ?  hives  ? Psudatabs [Pseudoephedrine Hcl] Hives and Other (See Comments)  ?  fever  ? ? ?Review of Systems ? ?   ?Objective:  ?  ?Physical Exam ? ?There were no vitals taken for this visit. ?Wt Readings from Last 3 Encounters:  ?04/19/21 170 lb (77.1 kg)  ?07/20/20 170 lb (77.1 kg)  ?06/24/20 170 lb (77.1 kg)  ? ? ?Health Maintenance Due  ?Topic Date Due  ? HPV VACCINES (1 - 2-dose series) Never done  ? Hepatitis C Screening  Never done  ? COVID-19 Vaccine (5 - Booster) 10/20/2019  ? ? ?   ?Topic Date Due  ? HPV VACCINES (1 - 2-dose series) Never done  ? ? ? ?  Lab Results  ?Component Value Date  ? TSH 1.860 08/20/2019  ? ?Lab Results  ?Component Value Date  ? WBC 5.2 08/20/2019  ? HGB 13.1 08/20/2019  ? HCT 39.0 08/20/2019  ? MCV 85 08/20/2019  ? PLT 176 08/20/2019  ? ?Lab Results  ?Component Value Date  ? NA 141 08/20/2019  ? K 3.9 08/20/2019  ? CO2 19 (L) 08/20/2019  ? GLUCOSE 83 08/20/2019  ? BUN 12 08/20/2019  ? CREATININE 0.68 08/20/2019  ? BILITOT 1.1 08/20/2019  ? ALKPHOS 66 08/20/2019  ? AST 21 08/20/2019  ? ALT 19 08/20/2019  ? PROT 6.5 08/20/2019  ? ALBUMIN 4.4 08/20/2019  ? CALCIUM 8.7 08/20/2019  ? ANIONGAP 12 10/28/2017  ? ?Lab Results  ?Component Value Date  ? CHOL 107 03/07/2018  ? ?Lab Results  ?Component Value Date  ? HDL 36 (L) 03/07/2018  ? ?Lab Results  ?Component Value Date  ? [place] 57 03/07/2018  ? ?Lab Results  ?Component Value Date  ? TRIG 70 03/07/2018  ? ?Lab Results  ?Component Value Date  ? CHOLHDL 3.0 03/07/2018  ? ?Lab Results   ?Component Value Date  ? HGBA1C 5.1 03/07/2018  ? ? ?   ?Assessment & Plan:  ? ?Problem List Items Addressed This Visit   ?None ? ? ? ?No orders of the defined types were placed in this encounter. ? ? ? ?Irene Pap, PA-C ? ?

## 2021-07-15 ENCOUNTER — Ambulatory Visit: Payer: 59 | Admitting: Physician Assistant

## 2021-11-11 ENCOUNTER — Encounter (INDEPENDENT_AMBULATORY_CARE_PROVIDER_SITE_OTHER): Payer: Self-pay

## 2021-11-11 ENCOUNTER — Encounter: Payer: Self-pay | Admitting: Internal Medicine

## 2021-12-12 ENCOUNTER — Telehealth: Payer: Self-pay | Admitting: Physician Assistant

## 2021-12-12 NOTE — Telephone Encounter (Signed)
Received a request for records from Dr. Maryelizabeth Rowan. Request forwarded to HIM and PCP removed as Dr. Duanne Guess is now PCP.

## 2023-08-14 DIAGNOSIS — G4700 Insomnia, unspecified: Secondary | ICD-10-CM | POA: Diagnosis not present

## 2023-08-14 DIAGNOSIS — F411 Generalized anxiety disorder: Secondary | ICD-10-CM | POA: Diagnosis not present

## 2023-08-14 DIAGNOSIS — F331 Major depressive disorder, recurrent, moderate: Secondary | ICD-10-CM | POA: Diagnosis not present

## 2023-09-12 DIAGNOSIS — F331 Major depressive disorder, recurrent, moderate: Secondary | ICD-10-CM | POA: Diagnosis not present

## 2023-09-12 DIAGNOSIS — F411 Generalized anxiety disorder: Secondary | ICD-10-CM | POA: Diagnosis not present

## 2023-09-12 DIAGNOSIS — G4700 Insomnia, unspecified: Secondary | ICD-10-CM | POA: Diagnosis not present

## 2023-10-05 DIAGNOSIS — F411 Generalized anxiety disorder: Secondary | ICD-10-CM | POA: Diagnosis not present

## 2023-10-05 DIAGNOSIS — J309 Allergic rhinitis, unspecified: Secondary | ICD-10-CM | POA: Diagnosis not present

## 2023-10-05 DIAGNOSIS — G4700 Insomnia, unspecified: Secondary | ICD-10-CM | POA: Diagnosis not present

## 2023-10-05 DIAGNOSIS — F331 Major depressive disorder, recurrent, moderate: Secondary | ICD-10-CM | POA: Diagnosis not present

## 2023-11-15 DIAGNOSIS — Z309 Encounter for contraceptive management, unspecified: Secondary | ICD-10-CM | POA: Diagnosis not present

## 2023-11-15 DIAGNOSIS — Z975 Presence of (intrauterine) contraceptive device: Secondary | ICD-10-CM | POA: Diagnosis not present

## 2023-11-15 DIAGNOSIS — Z0000 Encounter for general adult medical examination without abnormal findings: Secondary | ICD-10-CM | POA: Diagnosis not present

## 2023-11-15 DIAGNOSIS — Q742 Other congenital malformations of lower limb(s), including pelvic girdle: Secondary | ICD-10-CM | POA: Diagnosis not present

## 2023-11-15 DIAGNOSIS — Z79899 Other long term (current) drug therapy: Secondary | ICD-10-CM | POA: Diagnosis not present

## 2023-11-15 DIAGNOSIS — Z13220 Encounter for screening for lipoid disorders: Secondary | ICD-10-CM | POA: Diagnosis not present

## 2023-11-27 DIAGNOSIS — Z3046 Encounter for surveillance of implantable subdermal contraceptive: Secondary | ICD-10-CM | POA: Diagnosis not present

## 2023-12-05 DIAGNOSIS — G4700 Insomnia, unspecified: Secondary | ICD-10-CM | POA: Diagnosis not present

## 2023-12-05 DIAGNOSIS — F331 Major depressive disorder, recurrent, moderate: Secondary | ICD-10-CM | POA: Diagnosis not present

## 2023-12-05 DIAGNOSIS — F411 Generalized anxiety disorder: Secondary | ICD-10-CM | POA: Diagnosis not present

## 2023-12-05 DIAGNOSIS — J309 Allergic rhinitis, unspecified: Secondary | ICD-10-CM | POA: Diagnosis not present

## 2024-04-23 DIAGNOSIS — F331 Major depressive disorder, recurrent, moderate: Secondary | ICD-10-CM | POA: Diagnosis not present

## 2024-04-23 DIAGNOSIS — F411 Generalized anxiety disorder: Secondary | ICD-10-CM | POA: Diagnosis not present

## 2024-04-23 DIAGNOSIS — G4700 Insomnia, unspecified: Secondary | ICD-10-CM | POA: Diagnosis not present

## 2024-04-23 DIAGNOSIS — F909 Attention-deficit hyperactivity disorder, unspecified type: Secondary | ICD-10-CM | POA: Diagnosis not present
